# Patient Record
Sex: Male | Born: 1985
Health system: Southern US, Community
[De-identification: ages and names within clinical notes are randomized; demographics above are authoritative.]

## PROBLEM LIST (undated history)

## (undated) DIAGNOSIS — Z9289 Personal history of other medical treatment: Secondary | ICD-10-CM

## (undated) HISTORY — PX: WISDOM TOOTH EXTRACTION: SHX21

---

## 2014-10-17 ENCOUNTER — Emergency Department (HOSPITAL_BASED_OUTPATIENT_CLINIC_OR_DEPARTMENT_OTHER)
Admission: EM | Admit: 2014-10-17 | Discharge: 2014-10-17 | Disposition: A | Discharge status: Home / Self Care | Payer: Medicaid Other | Attending: Emergency Medicine | Admitting: Emergency Medicine

## 2014-10-17 ENCOUNTER — Encounter (HOSPITAL_BASED_OUTPATIENT_CLINIC_OR_DEPARTMENT_OTHER): Payer: Self-pay | Admitting: *Deleted

## 2014-10-17 ENCOUNTER — Emergency Department (HOSPITAL_BASED_OUTPATIENT_CLINIC_OR_DEPARTMENT_OTHER): Payer: Medicaid Other

## 2014-10-17 DIAGNOSIS — J3489 Other specified disorders of nose and nasal sinuses: Secondary | ICD-10-CM | POA: Insufficient documentation

## 2014-10-17 DIAGNOSIS — R52 Pain, unspecified: Secondary | ICD-10-CM

## 2014-10-17 DIAGNOSIS — R0981 Nasal congestion: Secondary | ICD-10-CM | POA: Insufficient documentation

## 2014-10-17 DIAGNOSIS — R51 Headache: Secondary | ICD-10-CM | POA: Diagnosis present

## 2014-10-17 MED ORDER — CETIRIZINE-PSEUDOEPHEDRINE ER 5-120 MG PO TB12: 1.0000 | ORAL_TABLET | Freq: Every day | ORAL | Status: DC

## 2014-10-17 MED ORDER — SALINE SPRAY 0.65 % NA SOLN: 1.0000 | NASAL | Status: DC | PRN

## 2014-10-17 NOTE — ED Provider Notes (Addendum)
CSN: [id number]     Arrival date & time 10/17/14  0850 History   First MD Initiated Contact with Patient 10/17/14 [phone]     Chief Complaint  Patient presents with  . Headache     (Consider location/radiation/quality/duration/timing/severity/associated sxs/prior Treatment) Patient is a 29 y.o. male presenting with headaches. The history is provided by the patient.  Headache Pain location:  R parietal Quality:  Dull Radiates to:  Does not radiate Severity currently:  9/10 Severity at highest:  9/10 Onset quality:  Gradual Duration:  2 weeks Timing:  Intermittent Progression:  Waxing and waning Chronicity:  Recurrent Similar to prior headaches: yes   Relieved by:  NSAIDs Worsened by:  Nothing tried Ineffective treatments:  None tried Associated symptoms: congestion and sinus pressure   Associated symptoms: no abdominal pain, no blurred vision, no fever, no nausea and no vomiting   Associated symptoms comment:  Right sided dental pain Risk factors comment:  Rhinoplasty after a fractured nose 4 months ago   History reviewed. No pertinent past medical history. History reviewed. No pertinent past surgical history. No family history on file. History  Substance Use Topics  . Smoking status: Never Smoker   . Smokeless tobacco: Never Used  . Alcohol Use: No    Review of Systems  Constitutional: Negative for fever.  HENT: Positive for congestion and sinus pressure.   Eyes: Negative for blurred vision.  Gastrointestinal: Negative for nausea, vomiting and abdominal pain.  Neurological: Positive for headaches.  All other systems reviewed and are negative.     Allergies  Review of patient's allergies indicates no known allergies.  Home Medications   Prior to Admission medications   Not on File   BP 124/79 mmHg  Pulse 76  Temp(Src) 97.4 F (36.3 C) (Oral)  Resp 16  Ht 6' (1.829 m)  Wt 150 lb (68.04 kg)  BMI 20.34 kg/m2  SpO2 97% Physical Exam  Constitutional: He  is oriented to person, place, and time. He appears well-developed and well-nourished. No distress.  HENT:  Head: Normocephalic and atraumatic.  Nose: Mucosal edema and sinus tenderness present. No rhinorrhea, nasal deformity or septal deviation. No epistaxis. Right sinus exhibits maxillary sinus tenderness. Right sinus exhibits no frontal sinus tenderness. Left sinus exhibits no maxillary sinus tenderness and no frontal sinus tenderness.  Mouth/Throat: Oropharynx is clear and moist.  Eyes: Conjunctivae and EOM are normal. Pupils are equal, round, and reactive to light.  Neck: Normal range of motion. Neck supple.  Cardiovascular: Normal rate, regular rhythm and intact distal pulses.   No murmur heard. Pulmonary/Chest: Effort normal and breath sounds normal. No respiratory distress. He has no wheezes. He has no rales.  Musculoskeletal: Normal range of motion. He exhibits no edema or tenderness.  Neurological: He is alert and oriented to person, place, and time.  Skin: Skin is warm and dry. No rash noted. No erythema.  Psychiatric: He has a normal mood and affect. His behavior is normal.  Nursing note and vitals reviewed.   ED Course  Procedures (including critical care time) Labs Review Labs Reviewed - No data to display  Imaging Review [place] [facility]  10/17/2014   CLINICAL DATA:  Right maxillary pain and pressure  EXAM: CT PARANASAL SINUS LIMITED WITHOUT CONTRAST  TECHNIQUE: Non-contiguous multidetector CT images of the paranasal sinuses were obtained in a single plane without contrast.  COMPARISON:  None.  FINDINGS: The paranasal sinuses including the sphenoid, ethmoid, frontal and maxillary sinuses are identified without evidence  of disease. No air-fluid levels are identified. There is mild leftward deviation of the nasal septum. The the ostiomeatal complexes are patent bilaterally. The frontoethmoidal recesses are normal bilaterally. No bony abnormalities are identified. The  visualized portions of the brain and orbits are unremarkable.  IMPRESSION: Normal CT of the sinuses.   Electronically Signed   By: Kathreen Devoid   On: 10/17/2014 09:33     EKG Interpretation None      MDM   Final diagnoses:  Pain  Nasal pain  Nasal congestion    Patient with recent rhinoplasty approximately 4 months ago after having a broken nose for the last 2 weeks has had worsening right-sided facial pain, dental pain and congestion. He used Aleve yesterday for headache with mild improvement but has not used any nasal sprays and denies fever. He called the ENTs office today and they told him he would need a referral to see the ENT doctor Laurance Flatten with [facility].  Patient is otherwise well-appearing. Mild tenderness with palpation of the right side of the nose and nasal turbinates on the right side are slightly edematous. Tenderness over the right maxillary sinus.  9:37 AM Pt's sinuses are clear.  Will treat with nasal saline and antihistamines for congestion.  Will have f/u with Dr. Laurance Flatten.  Blanchie Dessert, MD 10/17/14 [place], MD 10/17/14 1001

## 2014-10-17 NOTE — ED Notes (Signed)
Patient states he has a four month history of head pain after having a rhinoplasty for a fractured nose.  States over the last two weeks, he has had a constant headache, teeth pain and pressure and congestion in the right side of his nose and face.

## 2014-10-17 NOTE — ED Notes (Signed)
MD at bedside. 

## 2014-11-23 ENCOUNTER — Emergency Department (HOSPITAL_BASED_OUTPATIENT_CLINIC_OR_DEPARTMENT_OTHER)
Admission: EM | Admit: 2014-11-23 | Discharge: 2014-11-23 | Disposition: A | Discharge status: Home / Self Care | Payer: Medicaid Other | Attending: Emergency Medicine | Admitting: Emergency Medicine

## 2014-11-23 ENCOUNTER — Emergency Department (HOSPITAL_BASED_OUTPATIENT_CLINIC_OR_DEPARTMENT_OTHER): Payer: Medicaid Other

## 2014-11-23 ENCOUNTER — Encounter (HOSPITAL_BASED_OUTPATIENT_CLINIC_OR_DEPARTMENT_OTHER): Payer: Self-pay | Admitting: Emergency Medicine

## 2014-11-23 DIAGNOSIS — Z79891 Long term (current) use of opiate analgesic: Secondary | ICD-10-CM | POA: Diagnosis not present

## 2014-11-23 DIAGNOSIS — K2970 Gastritis, unspecified, without bleeding: Secondary | ICD-10-CM | POA: Insufficient documentation

## 2014-11-23 DIAGNOSIS — Z792 Long term (current) use of antibiotics: Secondary | ICD-10-CM | POA: Diagnosis not present

## 2014-11-23 DIAGNOSIS — Z791 Long term (current) use of non-steroidal anti-inflammatories (NSAID): Secondary | ICD-10-CM | POA: Diagnosis not present

## 2014-11-23 DIAGNOSIS — R109 Unspecified abdominal pain: Secondary | ICD-10-CM | POA: Diagnosis present

## 2014-11-23 HISTORY — DX: Personal history of other medical treatment: Z92.89

## 2014-11-23 LAB — LIPASE, BLOOD: LIPASE: 21 U/L (ref 11–59)

## 2014-11-23 LAB — URINALYSIS, ROUTINE W REFLEX MICROSCOPIC
Bilirubin Urine: NEGATIVE
GLUCOSE, UA: NEGATIVE mg/dL
Ketones, ur: NEGATIVE mg/dL
LEUKOCYTES UA: NEGATIVE
Nitrite: NEGATIVE
PH: 5.5 (ref 5.0–8.0)
Protein, ur: NEGATIVE mg/dL
Specific Gravity, Urine: 1.011 (ref 1.005–1.030)
Urobilinogen, UA: 0.2 mg/dL (ref 0.0–1.0)

## 2014-11-23 LAB — CBC WITH DIFFERENTIAL/PLATELET
Basophils Absolute: 0 10*3/uL (ref 0.0–0.1)
Basophils Relative: 0 % (ref 0–1)
EOS ABS: 0.3 10*3/uL (ref 0.0–0.7)
Eosinophils Relative: 5 % (ref 0–5)
HCT: 44.1 % (ref 39.0–52.0)
HEMOGLOBIN: 14.7 g/dL (ref 13.0–17.0)
LYMPHS ABS: 1.2 10*3/uL (ref 0.7–4.0)
Lymphocytes Relative: 21 % (ref 12–46)
MCH: 30.2 pg (ref 26.0–34.0)
MCHC: 33.3 g/dL (ref 30.0–36.0)
MCV: 90.6 fL (ref 78.0–100.0)
Monocytes Absolute: 0.5 10*3/uL (ref 0.1–1.0)
Monocytes Relative: 8 % (ref 3–12)
NEUTROS ABS: 3.8 10*3/uL (ref 1.7–7.7)
NEUTROS PCT: 66 % (ref 43–77)
Platelets: 193 10*3/uL (ref 150–400)
RBC: 4.87 MIL/uL (ref 4.22–5.81)
RDW: 12.4 % (ref 11.5–15.5)
WBC: 5.7 10*3/uL (ref 4.0–10.5)

## 2014-11-23 LAB — COMPREHENSIVE METABOLIC PANEL
ALBUMIN: 4.3 g/dL (ref 3.5–5.2)
ALT: 22 U/L (ref 0–53)
AST: 27 U/L (ref 0–37)
Alkaline Phosphatase: 77 U/L (ref 39–117)
Anion gap: 2 — ABNORMAL LOW (ref 5–15)
BILIRUBIN TOTAL: 1.1 mg/dL (ref 0.3–1.2)
BUN: 14 mg/dL (ref 6–23)
CO2: 32 mmol/L (ref 19–32)
CREATININE: 0.73 mg/dL (ref 0.50–1.35)
Calcium: 8.9 mg/dL (ref 8.4–10.5)
Chloride: 104 mmol/L (ref 96–112)
GFR calc Af Amer: >90 mL/min (ref >90)
GFR calc non Af Amer: >90 mL/min (ref >90)
Glucose, Bld: 101 mg/dL — ABNORMAL HIGH (ref 70–99)
POTASSIUM: 3.9 mmol/L (ref 3.5–5.1)
SODIUM: 138 mmol/L (ref 135–145)
Total Protein: 7 g/dL (ref 6.0–8.3)

## 2014-11-23 LAB — URINE MICROSCOPIC-ADD ON

## 2014-11-23 MED ORDER — PANTOPRAZOLE SODIUM 40 MG IV SOLR
40.0000 mg | Freq: Once | INTRAVENOUS | Status: AC
  Administered 2014-11-23: 40 mg via INTRAVENOUS
  Filled 2014-11-23: qty 40

## 2014-11-23 MED ORDER — SODIUM CHLORIDE 0.9 % IV BOLUS (SEPSIS)
1000.0000 mL | Freq: Once | INTRAVENOUS | Status: AC
  Administered 2014-11-23: 1000 mL via INTRAVENOUS

## 2014-11-23 MED ORDER — MORPHINE SULFATE 4 MG/ML IJ SOLN
4.0000 mg | Freq: Once | INTRAMUSCULAR | Status: AC
  Administered 2014-11-23: 4 mg via INTRAVENOUS
  Filled 2014-11-23: qty 1

## 2014-11-23 MED ORDER — SUCRALFATE 1 G PO TABS: 1.0000 g | ORAL_TABLET | Freq: Three times a day (TID) | ORAL | Status: DC

## 2014-11-23 MED ORDER — OMEPRAZOLE 20 MG PO CPDR: 20.0000 mg | DELAYED_RELEASE_CAPSULE | Freq: Every day | ORAL | Status: DC

## 2014-11-23 MED ORDER — ONDANSETRON HCL 4 MG/2ML IJ SOLN
4.0000 mg | Freq: Once | INTRAMUSCULAR | Status: AC
  Administered 2014-11-23: 4 mg via INTRAVENOUS
  Filled 2014-11-23: qty 2

## 2014-11-23 NOTE — ED Notes (Signed)
Pt having acute abdominal pain since yesterday evening.  Some diarrhea.  No N/V.  Pt unable to sleep and pain has worsened this am.  Pt bent over, grasping abdomen, unable to speak due to pain.

## 2014-11-23 NOTE — Discharge Instructions (Signed)

## 2014-11-23 NOTE — ED Provider Notes (Signed)
CSN: [id number]     Arrival date & time 11/23/14  4098 History   First MD Initiated Contact with Patient 11/23/14 [phone]     Chief Complaint  Patient presents with  . Abdominal Pain     (Consider location/radiation/quality/duration/timing/severity/associated sxs/prior Treatment) HPI Comments: Patient presents with abdominal pain. He's been taking penicillin ibuprofen and hydrocodone for a dental procedure he had recently. He said he had sudden onset of pain in his epigastrium. This morning. He denies any nausea vomiting or diarrhea. It started more dull like last night and got more pronounced this morning. He's having normal bowel movements. He denies any past abdominal surgeries. He denies any fevers or chills. He denies any urinary symptoms.  Patient is a 29 y.o. male presenting with abdominal pain.  Abdominal Pain Associated symptoms: no chest pain, no chills, no cough, no diarrhea, no fatigue, no fever, no hematuria, no nausea, no shortness of breath and no vomiting     Past Medical History  Diagnosis Date  . History of dental surgery    Past Surgical History  Procedure Laterality Date  . Wisdom tooth extraction     No family history on file. History  Substance Use Topics  . Smoking status: Never Smoker   . Smokeless tobacco: Never Used  . Alcohol Use: No    Review of Systems  Constitutional: Negative for fever, chills, diaphoresis and fatigue.  HENT: Negative for congestion, rhinorrhea and sneezing.   Eyes: Negative.   Respiratory: Negative for cough, chest tightness and shortness of breath.   Cardiovascular: Negative for chest pain and leg swelling.  Gastrointestinal: Positive for abdominal pain. Negative for nausea, vomiting, diarrhea and blood in stool.  Genitourinary: Negative for frequency, hematuria, flank pain and difficulty urinating.  Musculoskeletal: Negative for back pain and arthralgias.  Skin: Negative for rash.  Neurological: Negative for dizziness, speech  difficulty, weakness, numbness and headaches.      Allergies  Review of patient's allergies indicates no known allergies.  Home Medications   Prior to Admission medications   Medication Sig Start Date End Date Taking? Authorizing Provider  HYDROcodone-acetaminophen (NORCO/VICODIN) 5-325 MG per tablet Take 1 tablet by mouth every 6 (six) hours as needed for moderate pain.   Yes Historical Provider, MD  ibuprofen (ADVIL,MOTRIN) 800 MG tablet Take 800 mg by mouth every 8 (eight) hours as needed.   Yes Historical Provider, MD  penicillin v potassium (VEETID) 500 MG tablet Take 500 mg by mouth 4 (four) times daily.   Yes Historical Provider, MD  omeprazole (PRILOSEC) 20 MG capsule Take 1 capsule (20 mg total) by mouth daily. 11/23/14   Malvin Johns, MD  sucralfate (CARAFATE) 1 G tablet Take 1 tablet (1 g total) by mouth 4 (four) times daily -  with meals and at bedtime. 11/23/14   Malvin Johns, MD   BP 107/60 mmHg  Pulse 70  Temp(Src) 97.7 F (36.5 C)  Resp 22  Ht 6' (1.829 m)  Wt 150 lb (68.04 kg)  BMI 20.34 kg/m2  SpO2 99% Physical Exam  Constitutional: He is oriented to person, place, and time. He appears well-developed and well-nourished.  HENT:  Head: Normocephalic and atraumatic.  Eyes: Pupils are equal, round, and reactive to light.  Neck: Normal range of motion. Neck supple.  Cardiovascular: Normal rate, regular rhythm and normal heart sounds.   Pulmonary/Chest: Effort normal and breath sounds normal. No respiratory distress. He has no wheezes. He has no rales. He exhibits no tenderness.  Abdominal: Soft. Bowel  sounds are normal. There is tenderness (moderate tenderness to epigastrium). There is no rebound and no guarding.  Musculoskeletal: Normal range of motion. He exhibits no edema.  Lymphadenopathy:    He has no cervical adenopathy.  Neurological: He is alert and oriented to person, place, and time.  Skin: Skin is warm and dry. No rash noted.  Psychiatric: He has a  normal mood and affect.    ED Course  Procedures (including critical care time) Labs Review Labs Reviewed  COMPREHENSIVE METABOLIC PANEL - Abnormal; Notable for the following:    Glucose, Bld 101 (*)    Anion gap 2 (*)    All other components within normal limits  URINALYSIS, ROUTINE W REFLEX MICROSCOPIC - Abnormal; Notable for the following:    Hgb urine dipstick TRACE (*)    All other components within normal limits  CBC WITH DIFFERENTIAL/PLATELET  LIPASE, BLOOD  URINE MICROSCOPIC-ADD ON    Imaging Review Dg Abd Acute W/chest  11/23/2014   CLINICAL DATA:  Abdominal pain and diarrhea  EXAM: ACUTE ABDOMEN SERIES (ABDOMEN 2 VIEW & CHEST 1 VIEW)  COMPARISON:  None.  FINDINGS: Cardiac shadow is within normal limits. The lungs are clear bilaterally. Mild apical scarring is noted. No acute bony abnormality is seen.  Scattered large and small bowel gas is noted. No obstructive changes are seen. No abnormal calcifications are noted. No acute bony abnormality is seen.  IMPRESSION: No acute abnormality noted.   Electronically Signed   By: Inez Catalina M.D.   On: 11/23/2014 09:46     EKG Interpretation None      MDM   Final diagnoses:  Gastritis    Patient is given 1 dose of morphine as well as Zofran. He's given Protonix. He is feeling much better after this. He had no ongoing pain. His lipase is normal without evidence of pancreatitis. There is no pain around his gallbladder. He has no evidence of bowel obstruction. His repeat abdominal exam is benign. He's tolerating by mouth fluids. I feel this likely represents a gastritis. He was given a prescription for Carafate and Prilosec. He was given outpatient referral to gastroenterology if his symptoms continue.    Malvin Johns, MD 11/23/14 1314

## 2015-10-24 ENCOUNTER — Emergency Department (HOSPITAL_COMMUNITY)
Admission: EM | Admit: 2015-10-24 | Discharge: 2015-10-25 | Disposition: A | Discharge status: Home / Self Care | Payer: Medicaid Other | Attending: Emergency Medicine | Admitting: Emergency Medicine

## 2015-10-24 ENCOUNTER — Encounter (HOSPITAL_COMMUNITY): Payer: Self-pay | Admitting: Emergency Medicine

## 2015-10-24 DIAGNOSIS — Y998 Other external cause status: Secondary | ICD-10-CM | POA: Insufficient documentation

## 2015-10-24 DIAGNOSIS — F4329 Adjustment disorder with other symptoms: Secondary | ICD-10-CM | POA: Diagnosis present

## 2015-10-24 DIAGNOSIS — T39312A Poisoning by propionic acid derivatives, intentional self-harm, initial encounter: Secondary | ICD-10-CM | POA: Insufficient documentation

## 2015-10-24 DIAGNOSIS — Y9389 Activity, other specified: Secondary | ICD-10-CM | POA: Diagnosis not present

## 2015-10-24 DIAGNOSIS — Z791 Long term (current) use of non-steroidal anti-inflammatories (NSAID): Secondary | ICD-10-CM | POA: Insufficient documentation

## 2015-10-24 DIAGNOSIS — Y9289 Other specified places as the place of occurrence of the external cause: Secondary | ICD-10-CM | POA: Insufficient documentation

## 2015-10-24 DIAGNOSIS — T50902A Poisoning by unspecified drugs, medicaments and biological substances, intentional self-harm, initial encounter: Secondary | ICD-10-CM

## 2015-10-24 NOTE — ED Notes (Signed)
Pt here with [employer] for taking 10 500 mg naproxen tablet around 2200. Pt alert and oriented

## 2015-10-25 DIAGNOSIS — F4329 Adjustment disorder with other symptoms: Secondary | ICD-10-CM | POA: Diagnosis not present

## 2015-10-25 LAB — CBC
HCT: 44.6 % (ref 39.0–52.0)
HEMOGLOBIN: 14.9 g/dL (ref 13.0–17.0)
MCH: 30.5 pg (ref 26.0–34.0)
MCHC: 33.4 g/dL (ref 30.0–36.0)
MCV: 91.2 fL (ref 78.0–100.0)
Platelets: 234 10*3/uL (ref 150–400)
RBC: 4.89 MIL/uL (ref 4.22–5.81)
RDW: 12.7 % (ref 11.5–15.5)
WBC: 6.3 10*3/uL (ref 4.0–10.5)

## 2015-10-25 LAB — ETHANOL: Alcohol, Ethyl (B): <5 mg/dL (ref <5)

## 2015-10-25 LAB — COMPREHENSIVE METABOLIC PANEL
ALBUMIN: 5.1 g/dL — AB (ref 3.5–5.0)
ALT: 17 U/L (ref 17–63)
ANION GAP: 12 (ref 5–15)
AST: 21 U/L (ref 15–41)
Alkaline Phosphatase: 91 U/L (ref 38–126)
BUN: 25 mg/dL — ABNORMAL HIGH (ref 6–20)
CO2: 27 mmol/L (ref 22–32)
Calcium: 9.2 mg/dL (ref 8.9–10.3)
Chloride: 100 mmol/L — ABNORMAL LOW (ref 101–111)
Creatinine, Ser: 0.87 mg/dL (ref 0.61–1.24)
GFR calc Af Amer: >60 mL/min (ref >60)
GFR calc non Af Amer: >60 mL/min (ref >60)
GLUCOSE: 124 mg/dL — AB (ref 65–99)
Potassium: 3.6 mmol/L (ref 3.5–5.1)
SODIUM: 139 mmol/L (ref 135–145)
Total Bilirubin: 1.8 mg/dL — ABNORMAL HIGH (ref 0.3–1.2)
Total Protein: 7.7 g/dL (ref 6.5–8.1)

## 2015-10-25 LAB — RAPID URINE DRUG SCREEN, HOSP PERFORMED
Amphetamines: NOT DETECTED
BARBITURATES: NOT DETECTED
BENZODIAZEPINES: NOT DETECTED
COCAINE: NOT DETECTED
Opiates: NOT DETECTED
TETRAHYDROCANNABINOL: NOT DETECTED

## 2015-10-25 LAB — CBG MONITORING, ED: Glucose-Capillary: 87 mg/dL (ref 65–99)

## 2015-10-25 LAB — SALICYLATE LEVEL: Salicylate Lvl: <4 mg/dL (ref 2.8–30.0)

## 2015-10-25 LAB — ACETAMINOPHEN LEVEL: Acetaminophen (Tylenol), Serum: <10 ug/mL — ABNORMAL LOW (ref 10–30)

## 2015-10-25 NOTE — ED Notes (Signed)
Patient is INVOLUNTARILY COMMITTED

## 2015-10-25 NOTE — BH Assessment (Addendum)
Tele Assessment Note   Mario Simon is an 30 y.o. male.  -Clinician reviewed note by Dr. Ralene Bathe.  Pt had taken an overdose of about 10 naproxen in an effort to kill himself.  Patient said that he took the pills to harm himself because he and wife are having trouble in their marriage.  Patient explains that he and wife are having trouble in their marriage.  She has talked about moving out with their two children (ages 80 and 64).  Patient is afraid of losing the children and not being able to see them.  He relates that in 2015 they had a daughter that died at age 54 months.  He said that he did not want to have to lose the other children because of the marriage ending.  Patient appears to be devoted to family.  His wife is here at the hospital.  Patient has no current thoughts of killing himself.  He says he would never take that type of action again and the he was reacting out of fear.  Patient has no previous SI plans or attempts or intentions.  Patient denies any current recurrent HI and no A/V hallucinations.  Patient has no SA issues.  No previous inpatient or outpatient care.  -Clinician discussed patient care with Patriciaann Clan, PA.  He recommended patient be seen by psychiatry in AM to uphold or rescind the IVC.  Clinician discussed with Dr. Ralene Bathe and she agreed that patient needs to stay until psychiatry can talk with patient.  Diagnosis: MDD, single episode  Past Medical History:  Past Medical History  Diagnosis Date  . History of dental surgery     Past Surgical History  Procedure Laterality Date  . Wisdom tooth extraction      Family History: History reviewed. No pertinent family history.  Social History:  reports that he has never smoked. He has never used smokeless tobacco. He reports that he does not drink alcohol or use illicit drugs.  Additional Social History:  Alcohol / Drug Use Pain Medications: None Prescriptions: None Over the Counter: Naproxen sodium (prn) History  of alcohol / drug use?: No history of alcohol / drug abuse  CIWA: CIWA-Ar BP: 134/82 mmHg Pulse Rate: 96 COWS:    PATIENT STRENGTHS: (choose at least two) Ability for insight Average or above average intelligence Capable of independent living Communication skills Supportive family/friends  Allergies: No Known Allergies  Home Medications:  (Not in a hospital admission)  OB/GYN Status:  No LMP for male patient.  General Assessment Data Location of Assessment: [facility] TTS Assessment: In system Is this a Tele or Face-to-Face Assessment?: Face-to-Face Is this an Initial Assessment or a Re-assessment for this encounter?: Initial Assessment Marital status: Married Is patient pregnant?: No Pregnancy Status: No Living Arrangements: Spouse/significant other Can pt return to current living arrangement?: Yes Admission Status: Involuntary Is patient capable of signing voluntary admission?: No Referral Source: Self/Family/Friend Insurance type: MCD     Crisis Care Plan Living Arrangements: Spouse/significant other Name of Psychiatrist: None Name of Therapist: None  Education Status Is patient currently in school?: Yes Highest grade of school patient has completed: Some college  Risk to self with the past 6 months Suicidal Ideation: No-Not Currently/Within Last 6 Months Has patient been a risk to self within the past 6 months prior to admission? : No Suicidal Intent: No Has patient had any suicidal intent within the past 6 months prior to admission? : No Is patient at risk for suicide?:  Yes Suicidal Plan?: No Has patient had any suicidal plan within the past 6 months prior to admission? : No Access to Means: No What has been your use of drugs/alcohol within the last 12 months?: None Previous Attempts/Gestures: No How many times?: 0 Other Self Harm Risks: None Triggers for Past Attempts: None known Intentional Self Injurious Behavior: None Family Suicide History:  No Recent stressful life event(s): Conflict (Comment), Financial Problems (Conflict w/ wife over work.) Persecutory voices/beliefs?: No Depression: Yes Depression Symptoms: Despondent, Loss of interest in usual pleasures Substance abuse history and/or treatment for substance abuse?: No Suicide prevention information given to non-admitted patients: Not applicable  Risk to Others within the past 6 months Homicidal Ideation: No Does patient have any lifetime risk of violence toward others beyond the six months prior to admission? : No Thoughts of Harm to Others: No Current Homicidal Intent: No Current Homicidal Plan: No Access to Homicidal Means: No Identified Victim: No one History of harm to others?: No Assessment of Violence: None Noted Violent Behavior Description: None reported Does patient have access to weapons?: No Criminal Charges Pending?: No Does patient have a court date: No Is patient on probation?: No  Psychosis Hallucinations: None noted Delusions: None noted  Mental Status Report Appearance/Hygiene: In scrubs, Unremarkable Eye Contact: Good Motor Activity: Freedom of movement, Unremarkable Speech: Logical/coherent, Soft Level of Consciousness: Alert Mood: Anxious, Apprehensive Affect: Anxious, Apprehensive, Sad Anxiety Level: Moderate Thought Processes: Coherent, Relevant Judgement: Unimpaired Orientation: Person, Place, Time, Situation Obsessive Compulsive Thoughts/Behaviors: None  Cognitive Functioning Concentration: Normal Memory: Recent Intact, Remote Intact IQ: Average Insight: Fair Impulse Control: Poor Appetite: Good Weight Loss: 0 Weight Gain: 0 Sleep: Decreased Total Hours of Sleep:  (Taking a little longer to get to sleep.) Vegetative Symptoms: None  ADLScreening [facility] Assessment Services) Patient's cognitive ability adequate to safely complete daily activities?: Yes Patient able to express need for assistance with ADLs?:  Yes Independently performs ADLs?: Yes (appropriate for developmental age)  Prior Inpatient Therapy Prior Inpatient Therapy: Yes Prior Therapy Dates: None Prior Therapy Facilty/Provider(s): None Reason for Treatment: None  Prior Outpatient Therapy Prior Outpatient Therapy: No Prior Therapy Dates: N/A Prior Therapy Facilty/Provider(s): N/A Reason for Treatment: N/A Does patient have an ACCT team?: No Does patient have Intensive In-House Services?  : No Does patient have [facility] services? : No Does patient have P4CC services?: No  ADL Screening (condition at time of admission) Patient's cognitive ability adequate to safely complete daily activities?: Yes Is the patient deaf or have difficulty hearing?: No Does the patient have difficulty seeing, even when wearing glasses/contacts?: No Does the patient have difficulty concentrating, remembering, or making decisions?: No Patient able to express need for assistance with ADLs?: Yes Does the patient have difficulty dressing or bathing?: No Independently performs ADLs?: Yes (appropriate for developmental age) Does the patient have difficulty walking or climbing stairs?: No Weakness of Legs: None Weakness of Arms/Hands: None       Abuse/Neglect Assessment (Assessment to be complete while patient is alone) Physical Abuse: Denies Verbal Abuse: Denies Sexual Abuse: Denies Exploitation of patient/patient's resources: Denies Self-Neglect: Denies     Regulatory affairs officer (For Healthcare) Does patient have an advance directive?: No Would patient like information on creating an advanced directive?: No - patient declined information    Additional Information 1:1 In Past 12 Months?: No CIRT Risk: No Elopement Risk: No Does patient have medical clearance?: Yes     Disposition:  Disposition Initial Assessment Completed for this Encounter: Yes Disposition of  Patient: Other dispositions Other disposition(s): Other (Comment) (Pt to be  reviewed with PA)  Curlene Dolphin Ray 10/25/2015 3:46 AM

## 2015-10-25 NOTE — ED Provider Notes (Signed)
CSN: [id number]     Arrival date & time 10/24/15  2348 History  By signing my name below, I, Mario Simon, attest that this documentation has been prepared under the direction and in the presence of Quintella Reichert, MD. Electronically Signed: Soijett Simon, ED Scribe. 10/25/2015. 12:59 AM.   Chief Complaint  Patient presents with  . Drug Overdose    Pt took 10 naproxen      The history is provided by the patient. No language interpreter was used.    HPI Comments: Mario Simon is a 30 y.o. male  who presents to the Emergency Department complaining of drug overdose onset 2 hours ago. He reports that he took from 8-10 naprosyn and he is unsure of the Rx strength of the medications. He states that he took the medications due to having issues with his wife potentially leaving him. He notes that he was scared because typically the children go with the mother and he doesn't want his children taken away from him. He denies trying to harm himself or trying to harm himself in the past. He denies every seeing a psychiatrist or therapist. He reports that he does not have any pain currently, but he feels stupid at what he did. Pt notes that his mother-in-law called the cops for the situation. He states that he has not tried any medications for the relief for his symptoms. He denies nausea and any other symptoms. Denies any PMHx. Denies smoking cigarettes and he is an occasional alcohol user. Denies illegal street drug use or access to weapons in the house.   Past Medical History  Diagnosis Date  . History of dental surgery    Past Surgical History  Procedure Laterality Date  . Wisdom tooth extraction     History reviewed. No pertinent family history. Social History  Substance Use Topics  . Smoking status: Never Smoker   . Smokeless tobacco: Never Used  . Alcohol Use: No    Review of Systems  Gastrointestinal: Negative for nausea.  Skin: Negative for color change, rash and wound.   Psychiatric/Behavioral: Negative for suicidal ideas.  All other systems reviewed and are negative.     Allergies  Review of patient's allergies indicates no known allergies.  Home Medications   Prior to Admission medications   Medication Sig Start Date End Date Taking? Authorizing Provider  ibuprofen (ADVIL,MOTRIN) 800 MG tablet Take 800 mg by mouth every 8 (eight) hours as needed for moderate pain.    Yes Historical Provider, MD  naproxen (NAPROSYN) 500 MG tablet Take 500 mg by mouth 2 (two) times daily with a meal.   Yes Historical Provider, MD  omeprazole (PRILOSEC) 20 MG capsule Take 1 capsule (20 mg total) by mouth daily. Patient not taking: Reported on 10/25/2015 11/23/14   Malvin Johns, MD  sucralfate (CARAFATE) 1 G tablet Take 1 tablet (1 g total) by mouth 4 (four) times daily -  with meals and at bedtime. Patient not taking: Reported on 10/25/2015 11/23/14   Malvin Johns, MD   BP 113/72 mmHg  Pulse 100  Temp(Src) 98.1 F (36.7 C) (Oral)  Resp 16  SpO2 95% Physical Exam  Constitutional: He is oriented to person, place, and time. He appears well-developed and well-nourished.  HENT:  Head: Normocephalic and atraumatic.  Cardiovascular: Normal rate and regular rhythm.   No murmur heard. Pulmonary/Chest: Effort normal and breath sounds normal. No respiratory distress.  Abdominal: Soft. There is no tenderness. There is no rebound and no guarding.  Musculoskeletal: He exhibits no edema or tenderness.  Neurological: He is alert and oriented to person, place, and time.  Skin: Skin is warm and dry.  Psychiatric: He has a normal mood and affect. His behavior is normal.  Nursing note and vitals reviewed.   ED Course  Procedures (including critical care time) DIAGNOSTIC STUDIES: Oxygen Saturation is 98% on RA, nl by my interpretation.    COORDINATION OF CARE: 12:59 AM Discussed treatment plan with pt at bedside which includes labs, UA, EKG and pt agreed to plan.  1:24 AM-  Pt reassessed and he is asymptomatic at this time.  2:00 AM- Pt reassessed and he has no complaints at this time.  Labs Review Labs Reviewed  COMPREHENSIVE METABOLIC PANEL - Abnormal; Notable for the following:    Chloride 100 (*)    Glucose, Bld 124 (*)    BUN 25 (*)    Albumin 5.1 (*)    Total Bilirubin 1.8 (*)    All other components within normal limits  ACETAMINOPHEN LEVEL - Abnormal; Notable for the following:    Acetaminophen (Tylenol), Serum <10 (*)    All other components within normal limits  ETHANOL  SALICYLATE LEVEL  CBC  URINE RAPID DRUG SCREEN, HOSP PERFORMED  CBG MONITORING, ED    Imaging Review No results found. I have personally reviewed and evaluated these images and lab results as part of my medical decision-making.   EKG Interpretation   Date/Time:  Thursday October 25 2015 00:33:35 EST Ventricular Rate:  99 PR Interval:  138 QRS Duration: 103 QT Interval:  363 QTC Calculation: 466 R Axis:   83 Text Interpretation:  Sinus rhythm Biatrial enlargement ST elev, probable  normal early repol pattern Confirmed by Hazle Coca [phone]) on 10/25/2015  12:43:04 AM      MDM   Final diagnoses:  Drug overdose, undetermined intent, initial encounter    Patient here for psychiatric evaluation following ingestion of naproxen. Patient states that this was an attempt to scare his wife following a disagreement. He has been asymptomatic during his emergency department stay. He has been medically cleared for psychiatric evaluation. He was IVCd by police prior to ED arrival.  I personally performed the services described in this documentation, which was scribed in my presence. The recorded information has been reviewed and is accurate.   Quintella Reichert, MD 10/25/15 [phone]

## 2015-10-25 NOTE — [facility] Suicide Risk Assessment (Cosign Needed)
Suicide Risk Assessment  Discharge Assessment   [facility] Discharge Suicide Risk Assessment   Principal Problem: Adjustment disorder with disturbance of emotion Discharge Diagnoses:  Patient Active Problem List   Diagnosis Date Noted  . Adjustment disorder with disturbance of emotion [F43.29] 10/25/2015    Priority: Medium    Total Time spent with patient: 20 minutes  Musculoskeletal: Strength & Muscle Tone: within normal limits Gait & Station: normal Patient leans: N/A  Psychiatric Specialty Exam:   Blood pressure 138/89, pulse 112, temperature 97.9 F (36.6 C), temperature source Oral, resp. rate 15, SpO2 100 %.There is no weight on file to calculate BMI.  General Appearance: Casual and Fairly Groomed  Engineer, water:: Good  Speech: Clear and Coherent and Normal Rate  Volume: Normal  Mood: Euthymic  Affect: Congruent  Thought Process: Coherent, Goal Directed and Intact  Orientation: Full (Time, Place, and Person)  Thought Content: WDL  Suicidal Thoughts: No  Homicidal Thoughts: No  Memory: Immediate; Good Recent; Good Remote; Good  Judgement: Good  Insight: Good  Psychomotor Activity: Normal  Concentration: Good  Recall: NA  Fund of Knowledge:Good  Language: Good  Akathisia: No  Handed: Right  AIMS (if indicated):    Assets: Desire for Improvement  ADL's: Intact  Cognition: WNL         Mental Status Per Nursing Assessment::   On Admission:     Demographic Factors:  Male and Adolescent or young adult  Loss Factors: NA  Historical Factors: NA  Risk Reduction Factors:   Responsible for children under 22 years of age, Sense of responsibility to family, Religious beliefs about death, Living with another person, especially a relative and Positive therapeutic relationship  Continued Clinical Symptoms:  Depression:   Insomnia  Cognitive Features That Contribute To Risk:  Polarized thinking    Suicide  Risk:  Minimal: No identifiable suicidal ideation.  Patients presenting with no risk factors but with morbid ruminations; may be classified as minimal risk based on the severity of the depressive symptoms    Plan Of Care/Follow-up recommendations:  Activity:  As tolerated Diet:  regular  Delfin Gant, NP    PMHNP-BC 10/25/2015, 12:57 PM

## 2015-10-25 NOTE — ED Notes (Signed)
Patient wanded by security; patient's belongings searched by security. Patient is changed into purple scrubs.

## 2015-10-25 NOTE — ED Notes (Signed)
Per PC watch pt for 2 hours to look for nausea or vomiting.

## 2015-10-25 NOTE — ED Notes (Signed)
Poison control cleared patient at this time.

## 2015-10-25 NOTE — ED Notes (Signed)
Patient made aware that psychiatry has to evaluate patient before he can be released

## 2015-10-25 NOTE — Consult Note (Signed)
[facility] Consult   Reason for Consult:  OD,depression Referring Physician:  EDP Patient Identification: Mario Simon MRN:  [id number] Principal Diagnosis: Adjustment disorder with disturbance of emotion Diagnosis:   Patient Active Problem List   Diagnosis Date Noted  . Adjustment disorder with disturbance of emotion [F43.29] 10/25/2015    Priority: Medium    Total Time spent with patient: 45 minutes  Subjective:   Mario Simon is a 30 y.o. male patient admitted with OD depression.  HPI:  Hispanic male, 37 old was evaluated after he OD on 8-10 tablets of Naprosyn.  Patient states that he took the pills to get his wife's attention and not to kill himself.  Patient reports that he works a lot and has no time to spend with his wife.  He reports that they lost a 27 month old son 28 and that he and his wife are still struggling with the death.  Patient vehemently denies wanting to kill self.  He states that he love his wife and 2 kids and will never do anything to kill himself.  He denies previous attempt to kill self.  Patient denies SI/HI/AVH.  Patient is now discharged home and will follow up with [facility].  Past Psychiatric History: Denies   Risk to Self: Suicidal Ideation: No-Not Currently/Within Last 6 Months Suicidal Intent: No Is patient at risk for suicide?: Yes Suicidal Plan?: No Access to Means: No What has been your use of drugs/alcohol within the last 12 months?: None How many times?: 0 Other Self Harm Risks: None Triggers for Past Attempts: None known Intentional Self Injurious Behavior: None Risk to Others: Homicidal Ideation: No Thoughts of Harm to Others: No Current Homicidal Intent: No Current Homicidal Plan: No Access to Homicidal Means: No Identified Victim: No one History of harm to others?: No Assessment of Violence: None Noted Violent Behavior Description: None reported Does patient have access to weapons?: No Criminal Charges  Pending?: No Does patient have a court date: No Prior Inpatient Therapy: Prior Inpatient Therapy: Yes Prior Therapy Dates: None Prior Therapy Facilty/Provider(s): None Reason for Treatment: None Prior Outpatient Therapy: Prior Outpatient Therapy: No Prior Therapy Dates: N/A Prior Therapy Facilty/Provider(s): N/A Reason for Treatment: N/A Does patient have an ACCT team?: No Does patient have Intensive In-House Services?  : No Does patient have [facility] services? : No Does patient have P4CC services?: No  Past Medical History:  Past Medical History  Diagnosis Date  . History of dental surgery     Past Surgical History  Procedure Laterality Date  . Wisdom tooth extraction     Family History: History reviewed. No pertinent family history. Family Psychiatric  History:   Unknown Social History:  History  Alcohol Use No     History  Drug Use No    Social History   Social History  . Marital Status: Married    Spouse Name: N/A  . Number of Children: N/A  . Years of Education: N/A   Social History Main Topics  . Smoking status: Never Smoker   . Smokeless tobacco: Never Used  . Alcohol Use: No  . Drug Use: No  . Sexual Activity: Not Asked   Other Topics Concern  . None   Social History Narrative   Additional Social History:    Pain Medications: None Prescriptions: None Over the Counter: Naproxen sodium (prn) History of alcohol / drug use?: No history of alcohol / drug abuse   Allergies:  No Known Allergies  Labs:  Results for orders placed or performed during the hospital encounter of 10/24/15 (from the past 48 hour(s))  Comprehensive metabolic panel     Status: Abnormal   Collection Time: 10/25/15 12:14 AM  Result Value Ref Range   Sodium 139 135 - 145 mmol/L   Potassium 3.6 3.5 - 5.1 mmol/L   Chloride 100 (L) 101 - 111 mmol/L   CO2 27 22 - 32 mmol/L   Glucose, Bld 124 (H) 65 - 99 mg/dL   BUN 25 (H) 6 - 20 mg/dL   Creatinine, Ser 0.87 0.61 - 1.24 mg/dL    Calcium 9.2 8.9 - 10.3 mg/dL   Total Protein 7.7 6.5 - 8.1 g/dL   Albumin 5.1 (H) 3.5 - 5.0 g/dL   AST 21 15 - 41 U/L   ALT 17 17 - 63 U/L   Alkaline Phosphatase 91 38 - 126 U/L   Total Bilirubin 1.8 (H) 0.3 - 1.2 mg/dL   GFR calc non Af Amer >60 >60 mL/min   GFR calc Af Amer >60 >60 mL/min    Comment: (NOTE) The eGFR has been calculated using the CKD EPI equation. This calculation has not been validated in all clinical situations. eGFR's persistently <60 mL/min signify possible Chronic Kidney Disease.    Anion gap 12 5 - 15  Ethanol (ETOH)     Status: None   Collection Time: 10/25/15 12:14 AM  Result Value Ref Range   Alcohol, Ethyl (B) <5 <5 mg/dL    Comment:        LOWEST DETECTABLE LIMIT FOR SERUM ALCOHOL IS 5 mg/dL FOR MEDICAL PURPOSES ONLY   Salicylate level     Status: None   Collection Time: 10/25/15 12:14 AM  Result Value Ref Range   Salicylate Lvl <3.9 2.8 - 30.0 mg/dL  Acetaminophen level     Status: Abnormal   Collection Time: 10/25/15 12:14 AM  Result Value Ref Range   Acetaminophen (Tylenol), Serum <10 (L) 10 - 30 ug/mL    Comment:        THERAPEUTIC CONCENTRATIONS VARY SIGNIFICANTLY. A RANGE OF 10-30 ug/mL MAY BE AN EFFECTIVE CONCENTRATION FOR MANY PATIENTS. HOWEVER, SOME ARE BEST TREATED AT CONCENTRATIONS OUTSIDE THIS RANGE. ACETAMINOPHEN CONCENTRATIONS >150 ug/mL AT 4 HOURS AFTER INGESTION AND >50 ug/mL AT 12 HOURS AFTER INGESTION ARE OFTEN ASSOCIATED WITH TOXIC REACTIONS.   CBC     Status: None   Collection Time: 10/25/15 12:14 AM  Result Value Ref Range   WBC 6.3 4.0 - 10.5 K/uL   RBC 4.89 4.22 - 5.81 MIL/uL   Hemoglobin 14.9 13.0 - 17.0 g/dL   HCT 44.6 39.0 - 52.0 %   MCV 91.2 78.0 - 100.0 fL   MCH 30.5 26.0 - 34.0 pg   MCHC 33.4 30.0 - 36.0 g/dL   RDW 12.7 11.5 - 15.5 %   Platelets 234 150 - 400 K/uL  CBG monitoring, ED     Status: None   Collection Time: 10/25/15 12:39 AM  Result Value Ref Range   Glucose-Capillary 87 65 - 99  mg/dL  Urine rapid drug screen (hosp performed) (Not at [facility])     Status: None   Collection Time: 10/25/15 12:43 AM  Result Value Ref Range   Opiates NONE DETECTED NONE DETECTED   Cocaine NONE DETECTED NONE DETECTED   Benzodiazepines NONE DETECTED NONE DETECTED   Amphetamines NONE DETECTED NONE DETECTED   Tetrahydrocannabinol NONE DETECTED NONE DETECTED   Barbiturates NONE DETECTED NONE DETECTED    Comment:  DRUG SCREEN FOR MEDICAL PURPOSES ONLY.  IF CONFIRMATION IS NEEDED FOR ANY PURPOSE, NOTIFY LAB WITHIN 5 DAYS.        LOWEST DETECTABLE LIMITS FOR URINE DRUG SCREEN Drug Class       Cutoff (ng/mL) Amphetamine      1000 Barbiturate      200 Benzodiazepine   259 Tricyclics       563 Opiates          300 Cocaine          300 THC              50     No current facility-administered medications for this encounter.   Current Outpatient Prescriptions  Medication Sig Dispense Refill  . ibuprofen (ADVIL,MOTRIN) 800 MG tablet Take 800 mg by mouth every 8 (eight) hours as needed for moderate pain.     . naproxen (NAPROSYN) 500 MG tablet Take 500 mg by mouth 2 (two) times daily with a meal.    . omeprazole (PRILOSEC) 20 MG capsule Take 1 capsule (20 mg total) by mouth daily. (Patient not taking: Reported on 10/25/2015) 30 capsule 0  . sucralfate (CARAFATE) 1 G tablet Take 1 tablet (1 g total) by mouth 4 (four) times daily -  with meals and at bedtime. (Patient not taking: Reported on 10/25/2015) 30 tablet 0    Musculoskeletal: Strength & Muscle Tone: within normal limits Gait & Station: normal Patient leans: N/A  Psychiatric Specialty Exam: Review of Systems  Constitutional: Negative.   HENT: Negative.   Eyes: Negative.   Respiratory: Negative.   Cardiovascular: Negative.   Gastrointestinal: Negative.   Genitourinary: Negative.   Musculoskeletal: Negative.   Skin: Negative.   Neurological: Negative.   Endo/Heme/Allergies: Negative.     Blood pressure 113/72, pulse  100, temperature 98.1 F (36.7 C), temperature source Oral, resp. rate 16, SpO2 95 %.There is no weight on file to calculate BMI.  General Appearance: Casual and Fairly Groomed  Engineer, water::  Good  Speech:  Clear and Coherent and Normal Rate  Volume:  Normal  Mood:  Euthymic  Affect:  Congruent  Thought Process:  Coherent, Goal Directed and Intact  Orientation:  Full (Time, Place, and Person)  Thought Content:  WDL  Suicidal Thoughts:  No  Homicidal Thoughts:  No  Memory:  Immediate;   Good Recent;   Good Remote;   Good  Judgement:  Good  Insight:  Good  Psychomotor Activity:  Normal  Concentration:  Good  Recall:  NA  Fund of Knowledge:Good  Language: Good  Akathisia:  No  Handed:  Right  AIMS (if indicated):     Assets:  Desire for Improvement  ADL's:  Intact  Cognition: WNL  Sleep:      Disposition: Discharge home, follow up with [facility] for outpatient [facility] care and counseling.  Delfin Gant   PMHNP-BC 10/25/2015 11:47 AM Patient seen face-to-face for psychiatric evaluation, chart reviewed and case discussed with the physician extender and developed treatment plan. Reviewed the information documented and agree with the treatment plan. Corena Pilgrim, MD

## 2015-10-25 NOTE — Discharge Instructions (Signed)
For your ongoing behavioral health needs you are advised to follow up with [facility].  New patients are seen at their walk-in clinic.  Walk-in hours are Monday - Friday from 8:00 am - 12:00 pm, and from 1:00 pm - 3:00 pm.  Walk-in patients are seen on a first come, first served basis, so try to arrive as early as possible for the best chance of being seen the same day.  There is an initial fee of $22.50: ° °     [facility] °     [address] °     [place], [place] [address] °     [phone] °

## 2015-10-25 NOTE — BH Assessment (Signed)
[facility] Assessment Progress Note  Per Corena Pilgrim, MD, this pt does not require psychiatric hospitalization at this time.  He presents under IVC initiated by [facility] officer T. Timmothy Euler.  Pt is to be released from [facility] and discharged from [facility] with referral information for [facility] the [place].  IVC has been rescinded.  Referral information for [facility] has been included in pt's discharge instructions.  Pt's nurse has been notified.  Jalene Mullet, [facility] Triage Specialist [phone]

## 2015-10-25 NOTE — ED Notes (Signed)
Patient calling wife to let her know that he will be at the hospital for a while.

## 2015-10-25 NOTE — ED Notes (Signed)
Patient speaking to TTS.  

## 2015-10-25 NOTE — ED Notes (Signed)
Patient requesting to speak to MD. Ralene Bathe, MD notified.

## 2015-10-25 NOTE — ED Notes (Addendum)
MD at bedside. Patient states he took the naproxen pills to scare his wife because they were having a disagreement. Patient denies suicidal intentions, denies suicidal intentions in the past. Patient alert, oriented, and appears in no acute distress

## 2017-06-15 ENCOUNTER — Emergency Department (HOSPITAL_BASED_OUTPATIENT_CLINIC_OR_DEPARTMENT_OTHER)
Admission: EM | Admit: 2017-06-15 | Discharge: 2017-06-15 | Disposition: A | Discharge status: Home / Self Care | Payer: Self-pay | Attending: Emergency Medicine | Admitting: Emergency Medicine

## 2017-06-15 ENCOUNTER — Encounter (HOSPITAL_BASED_OUTPATIENT_CLINIC_OR_DEPARTMENT_OTHER): Payer: Self-pay | Admitting: Emergency Medicine

## 2017-06-15 DIAGNOSIS — Z79899 Other long term (current) drug therapy: Secondary | ICD-10-CM | POA: Insufficient documentation

## 2017-06-15 DIAGNOSIS — M25512 Pain in left shoulder: Secondary | ICD-10-CM | POA: Insufficient documentation

## 2017-06-15 MED ORDER — KETOROLAC TROMETHAMINE 15 MG/ML IJ SOLN
15.0000 mg | Freq: Once | INTRAMUSCULAR | Status: AC
  Administered 2017-06-15: 15 mg via INTRAMUSCULAR
  Filled 2017-06-15: qty 1

## 2017-06-15 MED ORDER — KETOROLAC TROMETHAMINE 15 MG/ML IJ SOLN: 15.0000 mg | Freq: Once | INTRAMUSCULAR | Status: DC

## 2017-06-15 NOTE — Discharge Instructions (Signed)
Do not take Advil over the next 24 hours, following this shot. You can continue taking tylenol as needed. Please follow up with Dr. Barbaraann Barthel if no improvement in 1 week

## 2017-06-15 NOTE — ED Provider Notes (Signed)
[facility] DEPT [facility] Provider Note   CSN: [id number] Arrival date & time: 06/15/17  0800     History   Chief Complaint Chief Complaint  Patient presents with  . Arm Pain    HPI Mario Simon is a 31 y.o. male.  HPI  Patient without significant past medical history presenting with less than 24 hours of the shoulder pain. Patient states that he awoke last night with shoulder pain. He denies any history of trauma. States he has never had shoulder pain like this before. Indicates that the pain is not radiating anywhere. He has taken ibuprofen without much relief overnight. He states he has limited range of motion due to the pain. Denies any numbness or tingling    Past Medical History:  Diagnosis Date  . History of dental surgery     Patient Active Problem List   Diagnosis Date Noted  . Adjustment disorder with disturbance of emotion 10/25/2015    Past Surgical History:  Procedure Laterality Date  . WISDOM TOOTH EXTRACTION         Home Medications    Prior to Admission medications   Medication Sig Start Date End Date Taking? Authorizing Provider  ibuprofen (ADVIL,MOTRIN) 800 MG tablet Take 800 mg by mouth every 8 (eight) hours as needed for moderate pain.     [provider]  naproxen (NAPROSYN) 500 MG tablet Take 500 mg by mouth 2 (two) times daily with a meal.    [provider]  omeprazole (PRILOSEC) 20 MG capsule Take 1 capsule (20 mg total) by mouth daily. Patient not taking: Reported on 10/25/2015 11/23/14   Malvin Johns, MD  sucralfate (CARAFATE) 1 G tablet Take 1 tablet (1 g total) by mouth 4 (four) times daily -  with meals and at bedtime. Patient not taking: Reported on 10/25/2015 11/23/14   Malvin Johns, MD    Family History No family history on file.  Social History Social History  Substance Use Topics  . Smoking status: Never Smoker  . Smokeless tobacco: Never Used  . Alcohol use Yes     Allergies   Patient has no known  allergies.   Review of Systems Review of Systems  Constitutional: Negative for chills and fever.  HENT: Negative for congestion.   Respiratory: Negative for cough.   Cardiovascular: Negative for chest pain.  Gastrointestinal: Negative for nausea and vomiting.  Musculoskeletal: Positive for arthralgias.  Skin: Negative for color change and rash.     Physical Exam Updated Vital Signs BP 132/89 (BP Location: Right Arm)   Pulse 63   Temp 97.6 F (36.4 C) (Oral)   Resp 18   Ht 6\' 3"  (1.905 m)   Wt 65.8 kg (145 lb)   SpO2 97%   BMI 18.12 kg/m   Physical Exam  Constitutional: He is oriented to person, place, and time. He appears well-developed and well-nourished.  HENT:  Head: Normocephalic and atraumatic.  Eyes: Conjunctivae are normal.  Neck: Normal range of motion. Neck supple.  Cardiovascular: Normal rate and regular rhythm.   Pulmonary/Chest: Effort normal and breath sounds normal.  Abdominal: Soft. Bowel sounds are normal.  Musculoskeletal:  Pain with palpation of rotator cuff, specifically supraspinatus, decreased range of motion with passive movement cannot complete full arch of motion, normal pulses bilaterally, normal sensation  Neurological: He is alert and oriented to person, place, and time.  Skin: Skin is warm. Capillary refill takes less than 2 seconds.     ED Treatments / Results  Labs (all labs ordered are listed, but only abnormal results are displayed) Labs Reviewed - No data to display  EKG  EKG Interpretation None      Radiology No results found.  Procedures Procedures (including critical care time)  Medications Ordered in ED Medications  ketorolac (TORADOL) 15 MG/ML injection 15 mg (not administered)    Initial Impression / Assessment and Plan / ED Course  I have reviewed the triage vital signs and the nursing notes. p Pertinent labs & imaging results that were available during my care of the patient were reviewed by me and  considered in my medical decision making (see chart for details).  Patient presenting with shoulder pain likely due to superaspinatus tendinopathy vs. adhesive capsulitis. Referral placed for sports medicine. Provided Toradol shot for pain  Final Clinical Impressions(s) / ED Diagnoses   Final diagnoses:  Acute pain of left shoulder    New Prescriptions New Prescriptions   No medications on file     Tonette Bihari, MD 06/15/17 Wilder, Alexander, MD 06/15/17 [phone]

## 2017-06-15 NOTE — ED Triage Notes (Addendum)
Pt reports waking up with L arm and shoulder pain. Pt reports taking advil this morning.

## 2017-06-15 NOTE — ED Notes (Signed)
ED Provider at bedside. 

## 2017-06-24 ENCOUNTER — Encounter: Payer: Self-pay | Admitting: Family Medicine

## 2017-06-24 ENCOUNTER — Ambulatory Visit (INDEPENDENT_AMBULATORY_CARE_PROVIDER_SITE_OTHER): Payer: Self-pay | Admitting: Family Medicine

## 2017-06-24 DIAGNOSIS — M79602 Pain in left arm: Secondary | ICD-10-CM

## 2017-06-24 MED ORDER — PREDNISONE 10 MG PO TABS: ORAL_TABLET | ORAL | 0 refills | Status: DC

## 2017-06-24 MED FILL — predniSONE 10 MG TABS: 10 | 6 days supply | Qty: 21 | Fill #0

## 2017-06-24 NOTE — Patient Instructions (Signed)
You suffered a stretch injury of a nerve on the left side. You have cervical radiculopathy (a pinched nerve in the neck). Prednisone 6 day dose pack to relieve irritation/inflammation of the nerve. You could start aleve OR ibuprofen after finishing the prednisone if needed. Simple range of motion exercises within limits of pain to prevent further stiffness. Consider physical therapy for stretching, exercises, traction, and modalities. Heat 15 minutes at a time 3-4 times a day to help with spasms. Watch head position when on computers, texting, when sleeping in bed - should in line with back to prevent further nerve traction and irritation. If not improving we will consider an MRI. Follow up with me in 1 month if not improving as expected.

## 2017-06-29 DIAGNOSIS — M79602 Pain in left arm: Secondary | ICD-10-CM | POA: Insufficient documentation

## 2017-06-29 NOTE — Assessment & Plan Note (Signed)
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  Start with prednisone dose pack, transition to aleve or ibuprofen.  Motion exercises.  Consider physical therapy.  Heat for spasms.  Discussed ergonomic issues.  F/u in 1 month.

## 2017-06-29 NOTE — Progress Notes (Signed)
PCP: Patient, No Pcp Per  Subjective:   HPI: Patient is a 31 y.o. male here for left arm pain.  Patient reports on 9/9 he woke up with pain on left side. Felt like it started with tenseness in left trapezius area and scapula, radiated into his upper arm. Feels weaker on this side. Pain up to 4/10 and sharp, worse at night. Was taking tylenol. No bowel/bladder dysfunction. No numbness, tingling.  Past Medical History:  Diagnosis Date  . History of dental surgery     Current Outpatient Prescriptions on File Prior to Visit  Medication Sig Dispense Refill  . omeprazole (PRILOSEC) 20 MG capsule Take 1 capsule (20 mg total) by mouth daily. (Patient not taking: Reported on 10/25/2015) 30 capsule 0  . sucralfate (CARAFATE) 1 G tablet Take 1 tablet (1 g total) by mouth 4 (four) times daily -  with meals and at bedtime. (Patient not taking: Reported on 10/25/2015) 30 tablet 0   No current facility-administered medications on file prior to visit.     Past Surgical History:  Procedure Laterality Date  . WISDOM TOOTH EXTRACTION      No Known Allergies  Social History   Social History  . Marital status: Married    Spouse name: N/A  . Number of children: N/A  . Years of education: N/A   Occupational History  . Not on file.   Social History Main Topics  . Smoking status: Never Smoker  . Smokeless tobacco: Never Used  . Alcohol use Yes  . Drug use: No  . Sexual activity: Not on file   Other Topics Concern  . Not on file   Social History Narrative  . No narrative on file    No family history on file.  BP 119/83   Pulse (!) 102   Ht 6\' 3"  (1.905 m)   Wt 145 lb (65.8 kg)   BMI 18.12 kg/m   Review of Systems: See HPI above.     Objective:  Physical Exam:  Gen: NAD, comfortable in exam room  Neck: No gross deformity, swelling, bruising. TTP left trapezius, medial to scapula.  No midline/bony TTP. FROM. BUE strength 5/5.   Sensation intact to light touch.    2+ equal reflexes in triceps, biceps, brachioradialis tendons. Negative spurlings. NV intact distal BUEs.  Left shoulder: No swelling, ecchymoses.  No gross deformity. No TTP except that noted above in neck exam. FROM. Negative Hawkins, Neers. Negative Yergasons. Strength 5/5 with empty can and resisted internal/external rotation. NV intact distally.   Assessment & Plan:  1. 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  Start with prednisone dose pack, transition to aleve or ibuprofen.  Motion exercises.  Consider physical therapy.  Heat for spasms.  Discussed ergonomic issues.  F/u in 1 month.

## 2020-10-25 ENCOUNTER — Emergency Department (HOSPITAL_BASED_OUTPATIENT_CLINIC_OR_DEPARTMENT_OTHER): Payer: 59

## 2020-10-25 ENCOUNTER — Encounter (HOSPITAL_BASED_OUTPATIENT_CLINIC_OR_DEPARTMENT_OTHER): Payer: Self-pay | Admitting: *Deleted

## 2020-10-25 ENCOUNTER — Other Ambulatory Visit: Payer: Self-pay

## 2020-10-25 ENCOUNTER — Emergency Department (HOSPITAL_BASED_OUTPATIENT_CLINIC_OR_DEPARTMENT_OTHER)
Admission: EM | Admit: 2020-10-25 | Discharge: 2020-10-25 | Disposition: A | Discharge status: Home / Self Care | Payer: 59 | Attending: Emergency Medicine | Admitting: Emergency Medicine

## 2020-10-25 DIAGNOSIS — R1084 Generalized abdominal pain: Secondary | ICD-10-CM | POA: Diagnosis not present

## 2020-10-25 LAB — COMPREHENSIVE METABOLIC PANEL
ALT: 20 U/L (ref 0–44)
AST: 19 U/L (ref 15–41)
Albumin: 4.7 g/dL (ref 3.5–5.0)
Alkaline Phosphatase: 84 U/L (ref 38–126)
Anion gap: 11 (ref 5–15)
BUN: 18 mg/dL (ref 6–20)
CO2: 25 mmol/L (ref 22–32)
Calcium: 9.5 mg/dL (ref 8.9–10.3)
Chloride: 102 mmol/L (ref 98–111)
Creatinine, Ser: 0.92 mg/dL (ref 0.61–1.24)
GFR, Estimated: >60 mL/min (ref >60)
Glucose, Bld: 99 mg/dL (ref 70–99)
Potassium: 3.7 mmol/L (ref 3.5–5.1)
Sodium: 138 mmol/L (ref 135–145)
Total Bilirubin: 0.5 mg/dL (ref 0.3–1.2)
Total Protein: 7.5 g/dL (ref 6.5–8.1)

## 2020-10-25 LAB — CBC
HCT: 45.8 % (ref 39.0–52.0)
Hemoglobin: 15.8 g/dL (ref 13.0–17.0)
MCH: 31.2 pg (ref 26.0–34.0)
MCHC: 34.5 g/dL (ref 30.0–36.0)
MCV: 90.3 fL (ref 80.0–100.0)
Platelets: 267 10*3/uL (ref 150–400)
RBC: 5.07 MIL/uL (ref 4.22–5.81)
RDW: 12.2 % (ref 11.5–15.5)
WBC: 7.5 10*3/uL (ref 4.0–10.5)
nRBC: 0 % (ref 0.0–0.2)

## 2020-10-25 LAB — LIPASE, BLOOD: Lipase: 30 U/L (ref 11–51)

## 2020-10-25 MED ORDER — DICYCLOMINE HCL 20 MG PO TABS: 20.0000 mg | ORAL_TABLET | Freq: Two times a day (BID) | ORAL | 0 refills | Status: DC

## 2020-10-25 MED ORDER — LIDOCAINE 5 % EX PTCH: 1.0000 | MEDICATED_PATCH | CUTANEOUS | Status: DC

## 2020-10-25 MED ORDER — DICYCLOMINE HCL 10 MG PO CAPS
10.0000 mg | ORAL_CAPSULE | Freq: Once | ORAL | Status: AC
  Administered 2020-10-25: 10 mg via ORAL
  Filled 2020-10-25: qty 1

## 2020-10-25 MED ORDER — LIDOCAINE 5 % EX PTCH: 1.0000 | MEDICATED_PATCH | CUTANEOUS | 0 refills | Status: DC

## 2020-10-25 MED ORDER — METHOCARBAMOL 500 MG PO TABS: 500.0000 mg | ORAL_TABLET | Freq: Two times a day (BID) | ORAL | 0 refills | Status: DC

## 2020-10-25 NOTE — ED Provider Notes (Signed)
[name] EMERGENCY DEPARTMENT Provider Note   CSN: [id number] Arrival date & time: 10/25/20  1417     History Chief Complaint  Patient presents with  . Abdominal Pain    Mario Simon is a 35 y.o. male.  HPI 35 year old male who presents to the ER with 4 days of feeling "gurgling" in his stomach, and some intermittent abdominal pain.  States that he wakes up in the morning and feels great, however started to develop some abdominal discomfort that is waxing and waning throughout the day.  He has been having normal bowel movements, not overly hard or soft, no blood.  Last bowel movement was this morning and normal.  He denies any nausea or vomiting.  He has been passing gas.  He has not taken anything for his symptoms.  Denies any fevers or chills.  He states that he has recently started a diet with his wife and has been eating a lot of salads.  He reports he does not eat much of a meal up until lunchtime, and even then only has a small sandwich as he picks his kids up from school.  He does not have any history of abdominal surgeries.  Denies any dysuria or hematuria.    Past Medical History:  Diagnosis Date  . History of dental surgery     Patient Active Problem List   Diagnosis Date Noted  . Left arm pain 06/29/2017  . Adjustment disorder with disturbance of emotion 10/25/2015    Past Surgical History:  Procedure Laterality Date  . WISDOM TOOTH EXTRACTION         No family history on file.  Social History   Tobacco Use  . Smoking status: Never Smoker  . Smokeless tobacco: Never Used  Substance Use Topics  . Alcohol use: Yes  . Drug use: No    Home Medications Prior to Admission medications   Medication Sig Start Date End Date Taking? Authorizing Provider  dicyclomine (BENTYL) 20 MG tablet Take 1 tablet (20 mg total) by mouth 2 (two) times daily. 10/25/20  Yes Garald Balding, PA-C  omeprazole (PRILOSEC) 20 MG capsule Take 1 capsule (20 mg total) by  mouth daily. Patient not taking: No sig reported 11/23/14   Malvin Johns, MD  predniSONE (DELTASONE) 10 MG tablet 6 tabs po day 1, 5 tabs po day 2, 4 tabs po day 3, 3 tabs po day 4, 2 tabs po day 5, 1 tab po day 6 06/24/17   Hudnall, Sharyn Lull, MD  sucralfate (CARAFATE) 1 G tablet Take 1 tablet (1 g total) by mouth 4 (four) times daily -  with meals and at bedtime. Patient not taking: No sig reported 11/23/14   Malvin Johns, MD    Allergies    Patient has no known allergies.  Review of Systems   Review of Systems  Constitutional: Negative for chills and fever.  HENT: Negative for ear pain and sore throat.   Eyes: Negative for pain and visual disturbance.  Respiratory: Negative for cough and shortness of breath.   Cardiovascular: Negative for chest pain and palpitations.  Gastrointestinal: Positive for abdominal pain. Negative for diarrhea, nausea and vomiting.  Genitourinary: Negative for dysuria and hematuria.  Musculoskeletal: Negative for arthralgias and back pain.  Skin: Negative for color change and rash.  Neurological: Negative for seizures and syncope.  All other systems reviewed and are negative.   Physical Exam Updated Vital Signs BP 128/87 (BP Location: Right Arm)   Pulse  76   Temp 97.9 F (36.6 C) (Oral)   Resp 18   Ht 6\' 3"  (1.905 m)   Wt 82.6 kg   SpO2 98%   BMI 22.75 kg/m   Physical Exam Vitals and nursing note reviewed.  Constitutional:      General: He is not in acute distress.    Appearance: He is well-developed and well-nourished. He is not ill-appearing, toxic-appearing or diaphoretic.  HENT:     Head: Normocephalic and atraumatic.  Eyes:     Conjunctiva/sclera: Conjunctivae normal.  Cardiovascular:     Rate and Rhythm: Normal rate and regular rhythm.     Heart sounds: Normal heart sounds. No murmur heard.   Pulmonary:     Effort: Pulmonary effort is normal. No respiratory distress.     Breath sounds: Normal breath sounds.  Abdominal:      Palpations: Abdomen is soft.     Tenderness: There is no abdominal tenderness. There is no right CVA tenderness, left CVA tenderness or guarding.     Hernia: No hernia is present.     Comments: No abdominal tenderness in all 4 quadrants on exam.  No flank tenderness.  Musculoskeletal:        General: No edema.     Cervical back: Neck supple.  Skin:    General: Skin is warm and dry.     Capillary Refill: Capillary refill takes less than 2 seconds.  Neurological:     General: No focal deficit present.     Mental Status: He is alert.  Psychiatric:        Mood and Affect: Mood and affect and mood normal.        Behavior: Behavior normal.     ED Results / Procedures / Treatments   Labs (all labs ordered are listed, but only abnormal results are displayed) Labs Reviewed  CBC  COMPREHENSIVE METABOLIC PANEL  LIPASE, BLOOD  URINALYSIS, ROUTINE W REFLEX MICROSCOPIC    EKG None  Radiology No results found.  Procedures Procedures (including critical care time)  Medications Ordered in ED Medications  dicyclomine (BENTYL) capsule 10 mg (10 mg Oral Given 10/25/20 1652)    ED Course  I have reviewed the triage vital signs and the nursing notes.  Pertinent labs & imaging results that were available during my care of the patient were reviewed by me and considered in my medical decision making (see chart for details).    MDM Rules/Calculators/A&P                          35 year old male planing of abdominal discomfort x4 days Presentation, his vitals overall reassuring, afebrile, not tachycardic tachypneic or hypoxic.  Physical exam with a benign abdominal exam, no flank tenderness.  CMP and CBC largely unremarkable, normal lipase.  No flank tenderness on exam.   Suspicion for surgical abdomen is low, as his abdomen is nontender.  He has no flank tenderness, does not have any symptoms of kidney stones or UTI.  He has no fever, chills, no signs of infectious symptoms.  I suspect  that his symptoms may be secondary due to diet changes, possible gas production.  His pain with here was relieved with Bentyl.  I do not think he needs any CT imaging at this time.  Patient is uninsured and does not have a PCP.  I encouraged him to follow-up with [facility].  We discussed return precautions, all of his questions  have been answered to his satisfaction, he voiced understanding and is agreeable.  At this stage in the ED course, the patient is medically screened and stable for discharge.     Final Clinical Impression(s) / ED Diagnoses Final diagnoses:  Generalized abdominal pain    Rx / DC Orders ED Discharge Orders         Ordered    methocarbamol (ROBAXIN) 500 MG tablet  2 times daily,   Status:  Discontinued        10/25/20 1625    lidocaine (LIDODERM) 5 %  Every 24 hours,   Status:  Discontinued        10/25/20 1625    dicyclomine (BENTYL) 20 MG tablet  2 times daily        10/25/20 1801           Lyndel Safe 10/25/20 1802    Drenda Freeze, MD 10/26/20 [phone]

## 2020-10-25 NOTE — ED Triage Notes (Signed)
Left mid abdominal pain and stomach "rumbling" for the past 4 days. Slightly constipated.

## 2020-10-25 NOTE — Discharge Instructions (Addendum)
Your work-up today was overall reassuring.  Please take the medicine prescribed as needed.  Please follow-up with [facility] which is a free clinic in the [place] area for further evaluation of your symptoms.  Return to the ER for any new or worsening symptoms.

## 2020-10-25 NOTE — ED Notes (Signed)
Patient verbalized understanding of dc instructions, prescriptions, follow up referrals and reasons to return to ER for reevaluation.  

## 2020-10-30 ENCOUNTER — Other Ambulatory Visit: Payer: Self-pay

## 2020-10-30 ENCOUNTER — Encounter (INDEPENDENT_AMBULATORY_CARE_PROVIDER_SITE_OTHER): Payer: Self-pay | Admitting: Primary Care

## 2020-10-30 ENCOUNTER — Ambulatory Visit (INDEPENDENT_AMBULATORY_CARE_PROVIDER_SITE_OTHER): Payer: 59 | Admitting: Primary Care

## 2020-10-30 VITALS — BP 123/85 | HR 84 | Temp 97.5°F | Ht 75.0 in | Wt 180.8 lb

## 2020-10-30 DIAGNOSIS — K921 Melena: Secondary | ICD-10-CM | POA: Diagnosis not present

## 2020-10-30 DIAGNOSIS — Z7689 Persons encountering health services in other specified circumstances: Secondary | ICD-10-CM

## 2020-10-30 DIAGNOSIS — K580 Irritable bowel syndrome with diarrhea: Secondary | ICD-10-CM | POA: Diagnosis not present

## 2020-10-30 NOTE — Patient Instructions (Signed)
Diverticulitis  Diverticulitis is when small pouches in your colon (large intestine) get infected or swollen. This causes pain in the belly (abdomen) and watery poop (diarrhea). These pouches are called diverticula. The pouches form in people who have a condition called diverticulosis. What are the causes? This condition may be caused by poop (stool) that gets trapped in the pouches in your colon. The poop lets germs (bacteria) grow in the pouches. This causes the infection. What increases the risk? You are more likely to get this condition if you have small pouches in your colon. The risk is higher if:  You are overweight or very overweight (obese).  You do not exercise enough.  You drink alcohol.  You smoke or use products with tobacco in them.  You eat a diet that has a lot of red meat such as beef, pork, or lamb.  You eat a diet that does not have enough fiber in it.  You are older than 34 years of age. What are the signs or symptoms?  Pain in the belly. Pain is often on the left side, but it may be in other areas.  Fever and feeling cold.  Feeling like you may vomit.  Vomiting.  Having cramps.  Feeling full.  Changes to how often you poop.  Blood in your poop. How is this treated? Most cases are treated at home by:  Taking over-the-counter pain medicines.  Following a clear liquid diet.  Taking antibiotic medicines.  Resting. Very bad cases may need to be treated at a hospital. This may include:  Not eating or drinking.  Taking prescription pain medicine.  Getting antibiotic medicines through an IV tube.  Getting fluid and food through an IV tube.  Having surgery. When you are feeling better, your doctor may tell you to have a test to check your colon (colonoscopy). Follow these instructions at home: Medicines  Take over-the-counter and prescription medicines only as told by your doctor. These include: ? Antibiotics. ? Pain medicines. ? Fiber  pills. ? Probiotics. ? Stool softeners.  If you were prescribed an antibiotic medicine, take it as told by your doctor. Do not stop taking the antibiotic even if you start to feel better.  Ask your doctor if the medicine prescribed to you requires you to avoid driving or using machinery. Eating and drinking  Follow a diet as told by your doctor.  When you feel better, your doctor may tell you to change your diet. You may need to eat a lot of fiber. Fiber makes it easier to poop (have a bowel movement). Foods with fiber include: ? Berries. ? Beans. ? Lentils. ? Green vegetables.  Avoid eating red meat.   General instructions  Do not use any products that contain nicotine or tobacco, such as cigarettes, e-cigarettes, and chewing tobacco. If you need help quitting, ask your doctor.  Exercise 3 or more times a week. Try to get 30 minutes each time. Exercise enough to sweat and make your heart beat faster.  Keep all follow-up visits as told by your doctor. This is important. Contact a doctor if:  Your pain does not get better.  You are not pooping like normal. Get help right away if:  Your pain gets worse.  Your symptoms do not get better.  Your symptoms get worse very fast.  You have a fever.  You vomit more than one time.  You have poop that is: ? Bloody. ? Black. ? Tarry. Summary  This condition happens when   small pouches in your colon get infected or swollen.  Take medicines only as told by your doctor.  Follow a diet as told by your doctor.  Keep all follow-up visits as told by your doctor. This is important. This information is not intended to replace advice given to you by your health care provider. Make sure you discuss any questions you have with your health care provider. Document Revised: 07/04/2019 Document Reviewed: 07/04/2019 Elsevier Patient Education  [place]. Diet for Irritable Bowel Syndrome When you have irritable bowel syndrome  (IBS), it is very important to eat the foods and follow the eating habits that are best for your condition. IBS may cause various symptoms such as pain in the abdomen, constipation, or diarrhea. Choosing the right foods can help to ease the discomfort from these symptoms. Work with your health care provider and diet and nutrition specialist (dietitian) to find the eating plan that will help to control your symptoms. What are tips for following this plan?  Keep a food diary. This will help you identify foods that cause symptoms. Write down: ? What you eat and when you eat it. ? What symptoms you have. ? When symptoms occur in relation to your meals, such as "pain in abdomen 2 hours after dinner."  Eat your meals slowly and in a relaxed setting.  Aim to eat 5-6 small meals per day. Do not skip meals.  Drink enough fluid to keep your urine pale yellow.  Ask your health care provider if you should take an over-the-counter probiotic to help restore healthy bacteria in your gut (digestive tract). ? Probiotics are foods that contain good bacteria and yeasts.  Your dietitian may have specific dietary recommendations for you based on your symptoms. He or she may recommend that you: ? Avoid foods that cause symptoms. Talk with your dietitian about other ways to get the same nutrients that are in those problem foods. ? Avoid foods with gluten. Gluten is a protein that is found in rye, wheat, and barley. ? Eat more foods that contain soluble fiber. Examples of foods with high soluble fiber include oats, seeds, and certain fruits and vegetables. Take a fiber supplement if directed by your dietitian. ? Reduce or avoid certain foods called FODMAPs. These are foods that contain carbohydrates that are hard to digest. Ask your doctor which foods contain these carbohydrates.      What foods are not recommended? The following are some foods and drinks that may make your symptoms worse:  Fatty foods, such as  french fries.  Foods that contain gluten, such as pasta and cereal.  Dairy products, such as milk, cheese, and ice cream.  Chocolate.  Alcohol.  Products with caffeine, such as coffee.  Carbonated drinks, such as soda.  Foods that are high in FODMAPs. These include certain fruits and vegetables.  Products with sweeteners such as honey, high fructose corn syrup, sorbitol, and mannitol. The items listed above may not be a complete list of foods and beverages you should avoid. Contact a dietitian for more information.   What foods are good sources of fiber? Your health care provider or dietitian may recommend that you eat more foods that contain fiber. Fiber can help to reduce constipation and other IBS symptoms. Add foods with fiber to your diet a little at a time so your body can get used to them. Too much fiber at one time might cause gas and swelling of your abdomen. The following are some foods that  are good sources of fiber:  Berries, such as raspberries, strawberries, and blueberries.  Tomatoes.  Carrots.  Brown rice.  Oats.  Seeds, such as chia and pumpkin seeds. The items listed above may not be a complete list of recommended sources of fiber. Contact your dietitian for more options. Where to find more information  International Foundation for Functional Gastrointestinal Disorders: www.iffgd.[employer] of Diabetes and Digestive and Kidney Diseases: [employer] Summary  When you have irritable bowel syndrome (IBS), it is very important to eat the foods and follow the eating habits that are best for your condition.  IBS may cause various symptoms such as pain in the abdomen, constipation, or diarrhea.  Choosing the right foods can help to ease the discomfort that comes from symptoms.  Keep a food diary. This will help you identify foods that cause symptoms.  Your health care provider or diet and nutrition specialist (dietitian) may recommend that  you eat more foods that contain fiber. This information is not intended to replace advice given to you by your health care provider. Make sure you discuss any questions you have with your health care provider. Document Revised: 05/24/2020 Document Reviewed: 05/24/2020 Elsevier Patient Education  2021 [employer].

## 2020-10-30 NOTE — Progress Notes (Signed)
New Patient Office Visit  Subjective:  Patient ID: Mario Simon, male    DOB: [birth year]  Age: 35 y.o. MRN: [id number]  CC:  Chief Complaint  Patient presents with  . Hospitalization Follow-up    Abdominal pain    HPI Mario Simon is a  35 y.o.male presents for follow up from the emergency room on .10/25/20, for abdominal discomfort x4 days discharged on the same day .He has diarrhea daily and tx with Bentyl in ED which he states help but interferes with working having to use the bathroom frequently .  Establishing care.  Past Medical History:  Diagnosis Date  . History of dental surgery     Past Surgical History:  Procedure Laterality Date  . WISDOM TOOTH EXTRACTION      No family history on file.  Social History   Socioeconomic History  . Marital status: Married    Spouse name: Not on file  . Number of children: Not on file  . Years of education: Not on file  . Highest education level: Not on file  Occupational History  . Not on file  Tobacco Use  . Smoking status: Never Smoker  . Smokeless tobacco: Never Used  Substance and Sexual Activity  . Alcohol use: Yes  . Drug use: No  . Sexual activity: Not on file  Other Topics Concern  . Not on file  Social History Narrative  . Not on file   Social Determinants of Health   Financial Resource Strain: Not on file  Food Insecurity: Not on file  Transportation Needs: Not on file  Physical Activity: Not on file  Stress: Not on file  Social Connections: Not on file  Intimate Partner Violence: Not on file     Past Medical History:  Diagnosis Date  . History of dental surgery      No Known Allergies    Current Outpatient Medications on File Prior to Visit  Medication Sig Dispense Refill  . dicyclomine (BENTYL) 20 MG tablet Take 1 tablet (20 mg total) by mouth 2 (two) times daily. 20 tablet 0  . predniSONE (DELTASONE) 10 MG tablet 6 tabs po day 1, 5 tabs po day 2, 4 tabs po day 3, 3 tabs po day 4, 2  tabs po day 5, 1 tab po day 6 21 tablet 0   No current facility-administered medications on file prior to visit.    ROS: all negative except above.   Physical Exam: Filed Weights   10/30/20 0924  Weight: 180 lb 12.8 oz (82 kg)   BP 123/85 (BP Location: Right Arm, Patient Position: Sitting, Cuff Size: Normal)   Pulse 84   Temp (!) 97.5 F (36.4 C) (Temporal)   Ht 6\' 3"  (1.905 m)   Wt 180 lb 12.8 oz (82 kg)   SpO2 95%   BMI 22.60 kg/m  General Appearance: Well nourished, in no apparent distress. Eyes: PERRLA, EOMs, conjunctiva no swelling or erythema Sinuses: No Frontal/maxillary tenderness ENT/Mouth: Ext aud canals clear, TMs without erythema, bulging.Marland Kitchen Hearing normal.  Neck: Supple, thyroid normal.  Respiratory: Respiratory effort normal, BS equal bilaterally without rales, rhonchi, wheezing or stridor.  Cardio: RRR with no MRGs. Brisk peripheral pulses without edema.  Abdomen: Soft, + BS.  Non tender, no guarding, rebound, hernias, masses. Lymphatics: Non tender without lymphadenopathy.  Musculoskeletal: Full ROM, 5/5 strength, normal gait.  Skin: Warm, dry without rashes, lesions, ecchymosis.  Neuro: Cranial nerves intact. Normal muscle tone, no cerebellar symptoms. Sensation intact.  Psych: Awake and oriented X 3, normal affect, Insight and Judgment appropriate.    Mario Simon was seen today for hospitalization follow-up.  Diagnoses and all orders for this visit:  Encounter to establish care  Establish care with PCP  Irritable bowel syndrome with diarrhea differential diverticulitis  -     Ambulatory referral to Gastroenterology  Blood in stool -     Ambulatory referral to Gastroenterology

## 2020-10-30 NOTE — Progress Notes (Signed)
Pt states pain starts in the afternoon and lasts until bedtime- only relief is sitting upright

## 2020-10-31 ENCOUNTER — Ambulatory Visit (INDEPENDENT_AMBULATORY_CARE_PROVIDER_SITE_OTHER): Payer: Self-pay

## 2020-10-31 ENCOUNTER — Encounter (HOSPITAL_BASED_OUTPATIENT_CLINIC_OR_DEPARTMENT_OTHER): Payer: Self-pay | Admitting: *Deleted

## 2020-10-31 ENCOUNTER — Emergency Department (HOSPITAL_BASED_OUTPATIENT_CLINIC_OR_DEPARTMENT_OTHER)
Admission: EM | Admit: 2020-10-31 | Discharge: 2020-11-01 | Disposition: A | Discharge status: Home / Self Care | Payer: 59 | Attending: Emergency Medicine | Admitting: Emergency Medicine

## 2020-10-31 ENCOUNTER — Other Ambulatory Visit: Payer: Self-pay

## 2020-10-31 ENCOUNTER — Emergency Department (HOSPITAL_BASED_OUTPATIENT_CLINIC_OR_DEPARTMENT_OTHER): Payer: 59

## 2020-10-31 DIAGNOSIS — K2970 Gastritis, unspecified, without bleeding: Secondary | ICD-10-CM

## 2020-10-31 DIAGNOSIS — K5281 Eosinophilic gastritis or gastroenteritis: Secondary | ICD-10-CM | POA: Insufficient documentation

## 2020-10-31 DIAGNOSIS — D72829 Elevated white blood cell count, unspecified: Secondary | ICD-10-CM | POA: Diagnosis not present

## 2020-10-31 DIAGNOSIS — D721 Eosinophilia, unspecified: Secondary | ICD-10-CM

## 2020-10-31 DIAGNOSIS — R109 Unspecified abdominal pain: Secondary | ICD-10-CM | POA: Diagnosis present

## 2020-10-31 LAB — CBC WITH DIFFERENTIAL/PLATELET
Abs Immature Granulocytes: 0.03 10*3/uL (ref 0.00–0.07)
Basophils Absolute: 0.1 10*3/uL (ref 0.0–0.1)
Basophils Relative: 1 %
Eosinophils Absolute: 2.3 10*3/uL — ABNORMAL HIGH (ref 0.0–0.5)
Eosinophils Relative: 18 %
HCT: 46 % (ref 39.0–52.0)
Hemoglobin: 15.9 g/dL (ref 13.0–17.0)
Immature Granulocytes: 0 %
Lymphocytes Relative: 16 %
Lymphs Abs: 2 10*3/uL (ref 0.7–4.0)
MCH: 30.9 pg (ref 26.0–34.0)
MCHC: 34.6 g/dL (ref 30.0–36.0)
MCV: 89.5 fL (ref 80.0–100.0)
Monocytes Absolute: 0.8 10*3/uL (ref 0.1–1.0)
Monocytes Relative: 6 %
Neutro Abs: 7.6 10*3/uL (ref 1.7–7.7)
Neutrophils Relative %: 59 %
Platelets: 289 10*3/uL (ref 150–400)
RBC: 5.14 MIL/uL (ref 4.22–5.81)
RDW: 12 % (ref 11.5–15.5)
WBC Morphology: ABNORMAL
WBC: 12.9 10*3/uL — ABNORMAL HIGH (ref 4.0–10.5)
nRBC: 0 % (ref 0.0–0.2)

## 2020-10-31 LAB — COMPREHENSIVE METABOLIC PANEL
ALT: 37 U/L (ref 0–44)
AST: 26 U/L (ref 15–41)
Albumin: 4.7 g/dL (ref 3.5–5.0)
Alkaline Phosphatase: 86 U/L (ref 38–126)
Anion gap: 10 (ref 5–15)
BUN: 17 mg/dL (ref 6–20)
CO2: 24 mmol/L (ref 22–32)
Calcium: 9.3 mg/dL (ref 8.9–10.3)
Chloride: 100 mmol/L (ref 98–111)
Creatinine, Ser: 0.97 mg/dL (ref 0.61–1.24)
GFR, Estimated: >60 mL/min (ref >60)
Glucose, Bld: 115 mg/dL — ABNORMAL HIGH (ref 70–99)
Potassium: 4.1 mmol/L (ref 3.5–5.1)
Sodium: 134 mmol/L — ABNORMAL LOW (ref 135–145)
Total Bilirubin: 0.9 mg/dL (ref 0.3–1.2)
Total Protein: 7.4 g/dL (ref 6.5–8.1)

## 2020-10-31 LAB — URINALYSIS, ROUTINE W REFLEX MICROSCOPIC
Bilirubin Urine: NEGATIVE
Glucose, UA: NEGATIVE mg/dL
Hgb urine dipstick: NEGATIVE
Ketones, ur: NEGATIVE mg/dL
Leukocytes,Ua: NEGATIVE
Nitrite: NEGATIVE
Protein, ur: NEGATIVE mg/dL
Specific Gravity, Urine: 1.01 (ref 1.005–1.030)
pH: 5 (ref 5.0–8.0)

## 2020-10-31 LAB — LIPASE, BLOOD: Lipase: 34 U/L (ref 11–51)

## 2020-10-31 MED ORDER — ONDANSETRON 4 MG PO TBDP: 4.0000 mg | ORAL_TABLET | Freq: Three times a day (TID) | ORAL | 0 refills | Status: DC | PRN

## 2020-10-31 MED ORDER — ONDANSETRON HCL 4 MG/2ML IJ SOLN
4.0000 mg | Freq: Once | INTRAMUSCULAR | Status: AC
  Administered 2020-10-31: 4 mg via INTRAVENOUS
  Filled 2020-10-31: qty 2

## 2020-10-31 MED ORDER — FENTANYL CITRATE (PF) 100 MCG/2ML IJ SOLN
50.0000 ug | Freq: Once | INTRAMUSCULAR | Status: AC
  Administered 2020-10-31: 50 ug via INTRAVENOUS
  Filled 2020-10-31: qty 2

## 2020-10-31 MED ORDER — IOHEXOL 300 MG/ML  SOLN
100.0000 mL | Freq: Once | INTRAMUSCULAR | Status: AC | PRN
  Administered 2020-10-31: 100 mL via INTRAVENOUS

## 2020-10-31 MED ORDER — PANTOPRAZOLE SODIUM 40 MG PO TBEC: 40.0000 mg | DELAYED_RELEASE_TABLET | Freq: Every day | ORAL | 0 refills | Status: DC

## 2020-10-31 NOTE — Telephone Encounter (Signed)
Patient called crying in pain and says the left side of his abdomen hurts, 10/10 pain scale. He says it doesn't move anywhere else, denies any other symptoms other than the pain. Advised to go to the ED for evaluation, he verbalized understanding.  Reason for Disposition . [1] SEVERE pain (e.g., excruciating) AND [2] present > 1 hour  Answer Assessment - Initial Assessment Questions 1. LOCATION: "Where does it hurt?"      Left side 2. RADIATION: "Does the pain shoot anywhere else?" (e.g., chest, back)     No 3. ONSET: "When did the pain begin?" (Minutes, hours or days ago)      This morning when woke up 4. SUDDEN: "Gradual or sudden onset?"     Gradual 5. PATTERN "Does the pain come and go, or is it constant?"    - If constant: "Is it getting better, staying the same, or worsening?"      (Note: Constant means the pain never goes away completely; most serious pain is constant and it progresses)     - If intermittent: "How long does it last?" "Do you have pain now?"     (Note: Intermittent means the pain goes away completely between bouts)     Worsening 6. SEVERITY: "How bad is the pain?"  (e.g., Scale 1-10; mild, moderate, or severe)    - MILD (1-3): doesn't interfere with normal activities, abdomen soft and not tender to touch     - MODERATE (4-7): interferes with normal activities or awakens from sleep, tender to touch     - SEVERE (8-10): excruciating pain, doubled over, unable to do any normal activities       10 7. RECURRENT SYMPTOM: "Have you ever had this type of stomach pain before?" If Yes, ask: "When was the last time?" and "What happened that time?"      Not this strong 8. CAUSE: "What do you think is causing the stomach pain?"     I don't know 9. RELIEVING/AGGRAVATING FACTORS: "What makes it better or worse?" (e.g., movement, antacids, bowel movement)     N/A 10. OTHER SYMPTOMS: "Has there been any vomiting, diarrhea, constipation, or urine problems?"       No  Protocols  used: ABDOMINAL PAIN - MALE-A-AH

## 2020-10-31 NOTE — Discharge Instructions (Signed)
Please schedule follow-up appointment with your primary doctor as well as with gastroenterology.  You should have a complete blood count with differential completed in around 1 week or so to monitor your white blood cell count and eosinophil count.

## 2020-10-31 NOTE — ED Triage Notes (Signed)
D pain x 6 days , Ed visit 1/20 and followed by PMD , scheduled to see GI, returns today for abd pain

## 2020-10-31 NOTE — ED Notes (Signed)
Patient transported to CT 

## 2020-10-31 NOTE — ED Provider Notes (Signed)
[facility] Provider Note   CSN: [id number] Arrival date & time: 10/31/20  1856     History Chief Complaint  Patient presents with  . Abdominal Pain    Mario Simon is a 35 y.o. male.  Presenting to ER with concern for abdominal pain.  Patient reports that over the last few days has been having intermittent abdominal pain.  Describes it as a crampy aching sensation, worse on his left side.  Has had some associated nausea but no vomiting.  Some loose stools, noted small blood in stool as well.  No blood today.  Had some improvement with Bentyl provided in ER but otherwise does not feel like anything seems to be helping.  No fevers.  Went to primary care office yesterday and they recommended following up with a gastroenterologist.  HPI     Past Medical History:  Diagnosis Date  . History of dental surgery     Patient Active Problem List   Diagnosis Date Noted  . Left arm pain 06/29/2017  . Adjustment disorder with disturbance of emotion 10/25/2015    Past Surgical History:  Procedure Laterality Date  . WISDOM TOOTH EXTRACTION         No family history on file.  Social History   Tobacco Use  . Smoking status: Never Smoker  . Smokeless tobacco: Never Used  Substance Use Topics  . Alcohol use: Yes  . Drug use: No    Home Medications Prior to Admission medications   Medication Sig Start Date End Date Taking? Authorizing Provider  dicyclomine (BENTYL) 20 MG tablet Take 1 tablet (20 mg total) by mouth 2 (two) times daily. 10/25/20   Garald Balding, PA-C  predniSONE (DELTASONE) 10 MG tablet 6 tabs po day 1, 5 tabs po day 2, 4 tabs po day 3, 3 tabs po day 4, 2 tabs po day 5, 1 tab po day 6 06/24/17   Hudnall, Sharyn Lull, MD    Allergies    Patient has no known allergies.  Review of Systems   Review of Systems  Constitutional: Negative for chills and fever.  HENT: Negative for ear pain and sore throat.   Eyes: Negative for pain and  visual disturbance.  Respiratory: Negative for cough and shortness of breath.   Cardiovascular: Negative for chest pain and palpitations.  Gastrointestinal: Positive for abdominal pain, blood in stool, diarrhea and nausea. Negative for vomiting.  Genitourinary: Negative for dysuria and hematuria.  Musculoskeletal: Negative for arthralgias and back pain.  Skin: Negative for color change and rash.  Neurological: Negative for seizures and syncope.  All other systems reviewed and are negative.   Physical Exam Updated Vital Signs BP (!) 145/86 (BP Location: Right Arm)   Pulse 72   Temp 98.2 F (36.8 C)   Resp 20   Ht 6\' 3"  (1.905 m)   Wt 81.6 kg   SpO2 99%   BMI 22.50 kg/m   Physical Exam Vitals and nursing note reviewed.  Constitutional:      Appearance: He is well-developed and well-nourished.  HENT:     Head: Normocephalic and atraumatic.  Eyes:     Conjunctiva/sclera: Conjunctivae normal.  Cardiovascular:     Rate and Rhythm: Normal rate and regular rhythm.     Heart sounds: No murmur heard.   Pulmonary:     Effort: Pulmonary effort is normal. No respiratory distress.     Breath sounds: Normal breath sounds.  Abdominal:  Palpations: Abdomen is soft.     Comments: Tenderness in left upper quadrant but no rebound or guarding  Musculoskeletal:        General: No edema.     Cervical back: Neck supple.  Skin:    General: Skin is warm and dry.  Neurological:     Mental Status: He is alert.  Psychiatric:        Mood and Affect: Mood and affect normal.     ED Results / Procedures / Treatments   Labs (all labs ordered are listed, but only abnormal results are displayed) Labs Reviewed  CBC WITH DIFFERENTIAL/PLATELET - Abnormal; Notable for the following components:      Result Value   WBC 12.9 (*)    Eosinophils Absolute 2.3 (*)    All other components within normal limits  COMPREHENSIVE METABOLIC PANEL - Abnormal; Notable for the following components:    Sodium 134 (*)    Glucose, Bld 115 (*)    All other components within normal limits  LIPASE, BLOOD  PATHOLOGIST SMEAR REVIEW  URINALYSIS, ROUTINE W REFLEX MICROSCOPIC    EKG None  Radiology CT ABDOMEN PELVIS W CONTRAST  Result Date: 10/31/2020 CLINICAL DATA:  Left abdominal pain, blood in stool EXAM: CT ABDOMEN AND PELVIS WITH CONTRAST TECHNIQUE: Multidetector CT imaging of the abdomen and pelvis was performed using the standard protocol following bolus administration of intravenous contrast. CONTRAST:  117mL OMNIPAQUE IOHEXOL 300 MG/ML  SOLN COMPARISON:  None. FINDINGS: Lower chest: Lung bases are clear. No effusions. Heart is normal size. Hepatobiliary: No focal hepatic abnormality. Gallbladder unremarkable. Pancreas: No focal abnormality or ductal dilatation. Spleen: No focal abnormality.  Normal size. Adrenals/Urinary Tract: No adrenal abnormality. No focal renal abnormality. No stones or hydronephrosis. Urinary bladder is unremarkable. Stomach/Bowel: Stomach, large and small bowel grossly unremarkable. Normal appendix. Vascular/Lymphatic: No evidence of aneurysm or adenopathy. Reproductive: No visible focal abnormality. Other: No free fluid or free air. Musculoskeletal: No acute bony abnormality. IMPRESSION: No acute findings in the abdomen or pelvis. Electronically Signed   By: Rolm Baptise M.D.   On: 10/31/2020 22:14    Procedures Procedures   Medications Ordered in ED Medications  fentaNYL (SUBLIMAZE) injection 50 mcg (50 mcg Intravenous Given 10/31/20 2149)  ondansetron (ZOFRAN) injection 4 mg (4 mg Intravenous Given 10/31/20 2149)  iohexol (OMNIPAQUE) 300 MG/ML solution 100 mL (100 mLs Intravenous Contrast Given 10/31/20 2203)    ED Course  I have reviewed the triage vital signs and the nursing notes.  Pertinent labs & imaging results that were available during my care of the patient were reviewed by me and considered in my medical decision making (see chart for details).     MDM Rules/Calculators/A&P                          35 year old male presenting to ER with concern for abdominal pain, nausea and loose stools.  On physical exam he was he was noted to be well-appearing with stable vitals, his abdomen was soft, minimal tenderness was noted left side.  Lab work noted for mild leukocytosis, eosinophilia.  CT abdomen pelvis negative for any acute abdominopelvic pathology.  UA negative for infection.  Suspect acute GI illness versus.  Eosinophilic gastrointestinal disease.  Believe patient would benefit from evaluation by gastroenterology.  Recommend bland diet, trial of PPI.  Recommended close follow-up with primary and GI, repeat CBC with diff.  Discussed CBC with lab, they sent off for path  review which will come back tomorrow.     After the discussed management above, the patient was determined to be safe for discharge.  The patient was in agreement with this plan and all questions regarding their care were answered.  ED return precautions were discussed and the patient will return to the ED with any significant worsening of condition.    Final Clinical Impression(s) / ED Diagnoses Final diagnoses:  None    Rx / DC Orders ED Discharge Orders    None       Lucrezia Starch, MD 11/01/20 0005

## 2020-11-02 LAB — PATHOLOGIST SMEAR REVIEW

## 2020-11-15 ENCOUNTER — Other Ambulatory Visit: Payer: Self-pay

## 2020-11-15 ENCOUNTER — Encounter: Payer: Self-pay | Admitting: Gastroenterology

## 2020-11-15 ENCOUNTER — Ambulatory Visit (INDEPENDENT_AMBULATORY_CARE_PROVIDER_SITE_OTHER): Payer: 59 | Admitting: Gastroenterology

## 2020-11-15 VITALS — BP 124/82 | HR 67 | Ht 75.0 in | Wt 178.0 lb

## 2020-11-15 DIAGNOSIS — R197 Diarrhea, unspecified: Secondary | ICD-10-CM | POA: Diagnosis not present

## 2020-11-15 DIAGNOSIS — R109 Unspecified abdominal pain: Secondary | ICD-10-CM | POA: Diagnosis not present

## 2020-11-15 DIAGNOSIS — K625 Hemorrhage of anus and rectum: Secondary | ICD-10-CM | POA: Diagnosis not present

## 2020-11-15 MED ORDER — PLENVU 140 G PO SOLR: 1.0000 | Freq: Once | ORAL | 0 refills | Status: AC

## 2020-11-15 MED ORDER — HYOSCYAMINE SULFATE 0.125 MG SL SUBL: 0.1250 mg | SUBLINGUAL_TABLET | SUBLINGUAL | 0 refills | Status: AC | PRN

## 2020-11-15 NOTE — Patient Instructions (Signed)
If you are age 34 or older, your body mass index should be between 23-30. Your Body mass index is 22.25 kg/m. If this is out of the aforementioned range listed, please consider follow up with your Primary Care Provider.  If you are age 50 or younger, your body mass index should be between 19-25. Your Body mass index is 22.25 kg/m. If this is out of the aformentioned range listed, please consider follow up with your Primary Care Provider.   We have sent the following medications to your pharmacy for you to pick up at your convenience: Levsin 0.125 mg every 6 hours as needed.   You have been scheduled for an endoscopy and colonoscopy. Please follow the written instructions given to you at your visit today. Please pick up your prep supplies at the pharmacy within the next 1-3 days. If you use inhalers (even only as needed), please bring them with you on the day of your procedure.  Due to recent changes in healthcare laws, you may see the results of your imaging and laboratory studies on MyChart before your provider has had a chance to review them.  We understand that in some cases there may be results that are confusing or concerning to you. Not all laboratory results come back in the same time frame and the provider may be waiting for multiple results in order to interpret others.  Please give [place] 48 hours in order for your provider to thoroughly review all the results before contacting the office for clarification of your results.

## 2020-11-15 NOTE — Progress Notes (Signed)
11/15/2020 Mario Simon [id number] [birth year]   HISTORY OF PRESENT ILLNESS: This is a pleasant 35 year old male with essentially no past medical history.  He tells me that a couple of months ago he developed left-sided abdominal pain.  Initially when it began he is having diarrhea and rectal bleeding, but those symptoms have resolved and now his bowels move regularly once or twice a day without blood.  The left side abdominal pain is still present, however, and sometimes gets very severe.  He says that prior to the onset of the symptoms he does not recall ever having any GI issues except for some issues with acid reflux in the past.  He says that the last episode of severe abdominal pain that he had when he went to the emergency department on January 26 was intolerable.  At that time his white blood cell count was elevated at about 13K and his absolute eosinophil count was elevated.  Lipase and CMP were unremarkable.  He had a CT scan of the abdomen and pelvis with contrast that did not show any cause of his symptoms.  He says that he has been told that he may have IBS, diverticulitis, eosinophilic gastroenteritis, etc.  He is also had no history of asthma, allergies, eczema or other atopic issues.    He was referred here by Mario Mire, NP.  Past Medical History:  Diagnosis Date  . History of dental surgery    Past Surgical History:  Procedure Laterality Date  . WISDOM TOOTH EXTRACTION      reports that he has never smoked. He has never used smokeless tobacco. He reports current alcohol use. He reports that he does not use drugs. family history includes Colon cancer in his maternal grandfather and maternal grandmother. No Known Allergies    Outpatient Encounter Medications as of 11/15/2020  Medication Sig  . pantoprazole (PROTONIX) 40 MG tablet Take 1 tablet (40 mg total) by mouth daily.  . [DISCONTINUED] dicyclomine (BENTYL) 20 MG tablet Take 1 tablet (20 mg total) by mouth 2  (two) times daily.  . [DISCONTINUED] ondansetron (ZOFRAN ODT) 4 MG disintegrating tablet Take 1 tablet (4 mg total) by mouth every 8 (eight) hours as needed for nausea or vomiting.  . [DISCONTINUED] predniSONE (DELTASONE) 10 MG tablet 6 tabs po day 1, 5 tabs po day 2, 4 tabs po day 3, 3 tabs po day 4, 2 tabs po day 5, 1 tab po day 6   No facility-administered encounter medications on file as of 11/15/2020.    REVIEW OF SYSTEMS  : All other systems reviewed and negative except where noted in the History of Present Illness.   PHYSICAL EXAM: BP 124/82   Pulse 67   Ht 6\' 3"  (1.905 m)   Wt 178 lb (80.7 kg)   BMI 22.25 kg/m  General: Well developed male in no acute distress Head: Normocephalic and atraumatic Eyes:  Sclerae anicteric, conjunctiva pink. Ears: Normal auditory acuity Lungs: Clear throughout to auscultation; no W/R/R. Heart: Regular rate and rhythm; no M/R/G. Abdomen: Soft, non-distended.  BS present.  Mild left mid-abdominal TTP. Rectal:  Will be done at the time of colonoscopy. Musculoskeletal: Symmetrical with no gross deformities  Skin: No lesions on visible extremities Extremities: No edema  Neurological: Alert oriented x 4, grossly non-focal Psychological:  Alert and cooperative. Normal mood and affect  ASSESSMENT AND PLAN: *35 year old male with complaints of left mid abdominal pain for the past couple of months.  He says that  initially when this began he had diarrhea and rectal bleeding as well, but those symptoms have resolved.  He had an elevated white blood cell count as well as an elevated absolute eosinophil count on his labs.  CMP and lipase were normal.  CT scan of the abdomen and pelvis with contrast did not show any cause of his symptoms.  He says that the pain was absolutely intolerable at his last ED visit when the CT scan was performed.  He has been given diagnoses such as IBS although does not have any history of GI issues, diverticulitis which was not seen  on CT scan, and also eosinophilic gastroenteritis.  Differential diagnoses also includes Crohn's/ulcerative colitis.  He would like an EGD and colonoscopy to get to the bottom of the symptoms.  Will schedule these with Dr. Loletha Carrow.  In the interim for his abdominal pain he certainly can use Tylenol or heating pad and I will prescribe Levsin for him to use every 6 hours as needed.  Prescription sent to pharmacy.   CC:  Kerin Perna, NP

## 2020-11-20 ENCOUNTER — Telehealth: Payer: Self-pay | Admitting: *Deleted

## 2020-11-20 NOTE — Progress Notes (Signed)
____________________________________________________________  Attending physician addendum:  Thank you for sending this case to me. I have reviewed the entire note and agree with the plan.  Not yet clear how or if the eosinophilia present on second ED visit relates to symptoms.  Agree with endoscopic testing.  Wilfrid Lund, MD  ____________________________________________________________

## 2020-11-20 NOTE — Telephone Encounter (Signed)
-----   Message from Loralie Champagne, PA-C sent at 11/15/2020  4:53 PM EST ----- We please contact him and let him know that I would like him to discontinue his pantoprazole around March 9, about 2 weeks prior to his procedure.  I discussed this with him, but was not sure when his procedure was scheduled for and forgot to have you make mention of it in his after visit summary.  Thank you,  Jess

## 2020-11-21 NOTE — Telephone Encounter (Signed)
Left message for patient to call office.  

## 2020-12-25 ENCOUNTER — Telehealth: Payer: Self-pay | Admitting: Gastroenterology

## 2020-12-25 NOTE — Telephone Encounter (Signed)
Patient called back to check on status for prep.  Per CMA, will have Plenvu sample upfront for patient to pickup today; patient informed.

## 2020-12-25 NOTE — Telephone Encounter (Signed)
Pt states that his pharmacy still does not have Plenvu. They kept telling him to come back the following day but nothing. Pt's procedure is tomorrow at 3:30pm. He wants to know if he can have a different prep or if he needs to r/s. Pls call him.

## 2020-12-26 ENCOUNTER — Other Ambulatory Visit: Payer: Self-pay

## 2020-12-26 ENCOUNTER — Ambulatory Visit (AMBULATORY_SURGERY_CENTER): Payer: 59 | Admitting: Gastroenterology

## 2020-12-26 ENCOUNTER — Other Ambulatory Visit (INDEPENDENT_AMBULATORY_CARE_PROVIDER_SITE_OTHER): Payer: 59

## 2020-12-26 ENCOUNTER — Encounter: Payer: Self-pay | Admitting: Gastroenterology

## 2020-12-26 ENCOUNTER — Other Ambulatory Visit: Payer: Self-pay | Admitting: Gastroenterology

## 2020-12-26 VITALS — BP 117/77 | HR 73 | Temp 98.0°F | Resp 12 | Ht 75.0 in | Wt 178.0 lb

## 2020-12-26 DIAGNOSIS — R197 Diarrhea, unspecified: Secondary | ICD-10-CM

## 2020-12-26 DIAGNOSIS — K625 Hemorrhage of anus and rectum: Secondary | ICD-10-CM

## 2020-12-26 DIAGNOSIS — K2100 Gastro-esophageal reflux disease with esophagitis, without bleeding: Secondary | ICD-10-CM

## 2020-12-26 DIAGNOSIS — K5730 Diverticulosis of large intestine without perforation or abscess without bleeding: Secondary | ICD-10-CM | POA: Diagnosis not present

## 2020-12-26 DIAGNOSIS — K2970 Gastritis, unspecified, without bleeding: Secondary | ICD-10-CM

## 2020-12-26 DIAGNOSIS — D123 Benign neoplasm of transverse colon: Secondary | ICD-10-CM

## 2020-12-26 DIAGNOSIS — R1032 Left lower quadrant pain: Secondary | ICD-10-CM

## 2020-12-26 DIAGNOSIS — K200 Eosinophilic esophagitis: Secondary | ICD-10-CM | POA: Diagnosis not present

## 2020-12-26 LAB — CBC WITH DIFFERENTIAL/PLATELET
Basophils Absolute: 0.1 10*3/uL (ref 0.0–0.1)
Basophils Relative: 0.6 % (ref 0.0–3.0)
Eosinophils Absolute: 0.1 10*3/uL (ref 0.0–0.7)
Eosinophils Relative: 1.6 % (ref 0.0–5.0)
HCT: 44 % (ref 39.0–52.0)
Hemoglobin: 15 g/dL (ref 13.0–17.0)
Lymphocytes Relative: 14.2 % (ref 12.0–46.0)
Lymphs Abs: 1.3 10*3/uL (ref 0.7–4.0)
MCHC: 34.1 g/dL (ref 30.0–36.0)
MCV: 92.4 fl (ref 78.0–100.0)
Monocytes Absolute: 0.6 10*3/uL (ref 0.1–1.0)
Monocytes Relative: 6 % (ref 3.0–12.0)
Neutro Abs: 7.2 10*3/uL (ref 1.4–7.7)
Neutrophils Relative %: 77.6 % — ABNORMAL HIGH (ref 43.0–77.0)
Platelets: 230 10*3/uL (ref 150.0–400.0)
RBC: 4.76 Mil/uL (ref 4.22–5.81)
RDW: 13.3 % (ref 11.5–15.5)
WBC: 9.3 10*3/uL (ref 4.0–10.5)

## 2020-12-26 MED ORDER — PANTOPRAZOLE SODIUM 40 MG PO TBEC: 40.0000 mg | DELAYED_RELEASE_TABLET | Freq: Every day | ORAL | 1 refills | Status: AC

## 2020-12-26 MED ORDER — SODIUM CHLORIDE 0.9 % IV SOLN: 500.0000 mL | Freq: Once | INTRAVENOUS | Status: DC

## 2020-12-26 NOTE — Op Note (Signed)
[facility] Patient Name: Mario Simon Procedure Date: 12/26/2020 2:06 PM MRN: [id number] Endoscopist: Mallie Mussel L. Loletha Carrow , MD Age: 35 Referring MD:  Date of Birth: Jan 15, 1986 Gender: Male Account #: [id number] Procedure:                Colonoscopy Indications:              Abdominal pain in the left lower quadrant,                            Diarrhea, Rectal bleeding                           Severe LLQ pain, diarrhea and rectal bleeding 2                            months ago. Diarrhea/bleeding resolved, pain                            continued. Patient did not take hyoscyamine after                            office visit due to cost. However, all symptoms now                            reportedly resolved.                           peripheral eosinophilia noted on ED labs. Patient                            denies rash or angioedema. Medicines:                Monitored Anesthesia Care Procedure:                Pre-Anesthesia Assessment:                           - Prior to the procedure, a History and Physical                            was performed, and patient medications and                            allergies were reviewed. The patient's tolerance of                            previous anesthesia was also reviewed. The risks                            and benefits of the procedure and the sedation                            options and risks were discussed with the patient.  All questions were answered, and informed consent                            was obtained. Prior Anticoagulants: The patient has                            taken no previous anticoagulant or antiplatelet                            agents. ASA Grade Assessment: II - A patient with                            mild systemic disease. After reviewing the risks                            and benefits, the patient was deemed in                            satisfactory condition  to undergo the procedure.                           - Prior to the procedure, a History and Physical                            was performed, and patient medications and                            allergies were reviewed. The patient's tolerance of                            previous anesthesia was also reviewed. The risks                            and benefits of the procedure and the sedation                            options and risks were discussed with the patient.                            All questions were answered, and informed consent                            was obtained. Prior Anticoagulants: The patient has                            taken no previous anticoagulant or antiplatelet                            agents. ASA Grade Assessment: II - A patient with                            mild systemic disease. After reviewing the risks  and benefits, the patient was deemed in                            satisfactory condition to undergo the procedure.                           After obtaining informed consent, the colonoscope                            was passed under direct vision. Throughout the                            procedure, the patient's blood pressure, pulse, and                            oxygen saturations were monitored continuously. The                            Colonoscope was introduced through the anus and                            advanced to the the terminal ileum, with                            identification of the appendiceal orifice and IC                            valve. The colonoscopy was performed without                            difficulty. The patient tolerated the procedure                            well. The quality of the bowel preparation was                            excellent. The terminal ileum, ileocecal valve,                            appendiceal orifice, and rectum were photographed. Scope In: 3:02:08  PM Scope Out: 3:11:44 PM Scope Withdrawal Time: 0 hours 6 minutes 58 seconds  Total Procedure Duration: 0 hours 9 minutes 36 seconds  Findings:                 The perianal and digital rectal examinations were                            normal.                           The terminal ileum appeared normal.                           A few small-mouthed diverticula were found in the  left colon.                           Normal mucosa was found in the entire colon.                            Biopsies were taken with a cold forceps for                            histology (ascending,transverse, descending to r/o                            eosinophilic colitis).                           The exam was otherwise without abnormality on                            direct and retroflexion views. Complications:            No immediate complications. Estimated Blood Loss:     Estimated blood loss was minimal. Impression:               - The examined portion of the ileum was normal.                           - Diverticulosis in the left colon.                           - Normal mucosa in the entire examined colon.                            Biopsied.                           - The examination was otherwise normal on direct                            and retroflexion views. Recommendation:           - Patient has a contact number available for                            emergencies. The signs and symptoms of potential                            delayed complications were discussed with the                            patient. Return to normal activities tomorrow.                            Written discharge instructions were provided to the                            patient.                           -  Resume previous diet.                           - Continue present medications.                           - Await pathology results.                           - No  recommendation at this time regarding repeat                            colonoscopy due to young age.                           - See the other procedure note for documentation of                            additional recommendations.                           - CBC with differential today Cashawn Yanko L. Loletha Carrow, MD 12/26/2020 3:23:15 PM This report has been signed electronically.

## 2020-12-26 NOTE — Op Note (Signed)
[place] Patient Name: Mario Simon Procedure Date: 12/26/2020 2:05 PM MRN: [id number] Endoscopist: Robinette. 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 The patient's tolerance of                            previous anesthesia was also reviewed. The risks                            and benefits of the procedure and the sedation                            options and risks were discussed with the patient.                            All questions were answered, and informed consent                            was obtained. Prior Anticoagulants: The patient has                            taken no previous anticoagulant or antiplatelet                            agents. ASA Grade Assessment: II - A patient with                            mild systemic disease. After reviewing the risks                            and benefits, the patient was deemed in                            satisfactory condition to undergo the procedure.  After obtaining informed consent, the endoscope was                            passed under direct vision. Throughout the                            procedure, the patient's blood pressure, pulse, and                            oxygen saturations were monitored continuously. The                            Endoscope was introduced through the mouth, and                            advanced to the second part of duodenum. The upper                             GI endoscopy was accomplished without difficulty.                            The patient tolerated the procedure well. Scope In: Scope Out: Findings:                 The larynx was normal.                           One superficial esophageal ulcer with scant contact                            bleeding was found at the gastroesophageal                            junction. The lesion was 6 mm in largest dimension.                           Diffuse mucosal changes characterized by                            longitudinal markings were found in the lower third                            of the esophagus. Biopsies were taken with a cold                            forceps for histology from the mid and distal                            esophagus.                           The entire examined stomach was normal. Biopsies                            were taken with a  cold forceps for histology (to                            rule out eosinophilic gastritis).                           The examined duodenum was normal. Biopsies were                            taken with a cold forceps for histology (to rule of                            eosinophilic enteritis). Complications:            No immediate complications. Estimated Blood Loss:     Estimated blood loss was minimal. Impression:               - Normal larynx.                           - Esophageal ulcer with scant contact bleeding.                           - Longitudinally marked mucosa in the esophagus.                            Biopsied.                           - Normal stomach. Biopsied.                           - Normal examined duodenum. Biopsied. Recommendation:           - Patient has a contact number available for                            emergencies. The signs and symptoms of potential                            delayed complications were discussed with the                            patient. Return  to normal activities tomorrow.                            Written discharge instructions were provided to the                            patient.                           - Resume previous diet.                           - Continue present medications (pantoprazole once  daily).                           - Await pathology results.                           - See the other procedure note for documentation of                            additional recommendations.                           - Return to my office after studies are complete. Ozell Ferrera L. Loletha Carrow, MD 12/26/2020 3:18:22 PM This report has been signed electronically.

## 2020-12-26 NOTE — Progress Notes (Signed)
Report given to PACU, vss 

## 2020-12-26 NOTE — Progress Notes (Signed)
Called to room to assist during endoscopic procedure.  Patient ID and intended procedure confirmed with present staff. Received instructions for my participation in the procedure from the performing physician.  

## 2020-12-26 NOTE — Patient Instructions (Signed)
Handouts given for Esophagitis and Diverticulosis.  Await pathology results.  Go to the lab for bloodwork today before you leave.  YOU HAD AN ENDOSCOPIC PROCEDURE TODAY AT [facility]:   Refer to the procedure report that was given to you for any specific questions about what was found during the examination.  If the procedure report does not answer your questions, please call your gastroenterologist to clarify.  If you requested that your care partner not be given the details of your procedure findings, then the procedure report has been included in a sealed envelope for you to review at your convenience later.  YOU SHOULD EXPECT: Some feelings of bloating in the abdomen. Passage of more gas than usual.  Walking can help get rid of the air that was put into your GI tract during the procedure and reduce the bloating. If you had a lower endoscopy (such as a colonoscopy or flexible sigmoidoscopy) you may notice spotting of blood in your stool or on the toilet paper. If you underwent a bowel prep for your procedure, you may not have a normal bowel movement for a few days.  Please Note:  You might notice some irritation and congestion in your nose or some drainage.  This is from the oxygen used during your procedure.  There is no need for concern and it should clear up in a day or so.  SYMPTOMS TO REPORT IMMEDIATELY:   Following lower endoscopy (colonoscopy or flexible sigmoidoscopy):  Excessive amounts of blood in the stool  Significant tenderness or worsening of abdominal pains  Swelling of the abdomen that is new, acute  Fever of 100F or higher   Following upper endoscopy (EGD)  Vomiting of blood or coffee ground material  New chest pain or pain under the shoulder blades  Painful or persistently difficult swallowing  New shortness of breath  Fever of 100F or higher  Black, tarry-looking stools  For urgent or emergent issues, a gastroenterologist can be reached at any  hour by calling [phone]. Do not use MyChart messaging for urgent concerns.    DIET:  We do recommend a small meal at first, but then you may proceed to your regular diet.  Drink plenty of fluids but you should avoid alcoholic beverages for 24 hours.  ACTIVITY:  You should plan to take it easy for the rest of today and you should NOT DRIVE or use heavy machinery until tomorrow (because of the sedation medicines used during the test).    FOLLOW UP: Our staff will call the number listed on your records 48-72 hours following your procedure to check on you and address any questions or concerns that you may have regarding the information given to you following your procedure. If we do not reach you, we will leave a message.  We will attempt to reach you two times.  During this call, we will ask if you have developed any symptoms of COVID 19. If you develop any symptoms (ie: fever, flu-like symptoms, shortness of breath, cough etc.) before then, please call [phone].  If you test positive for Covid 19 in the 2 weeks post procedure, please call and report this information to [place].    If any biopsies were taken you will be contacted by phone or by letter within the next 1-3 weeks.  Please call us at [phone] if you have not heard about the biopsies in 3 weeks.    SIGNATURES/CONFIDENTIALITY: You and/or your care partner have signed paperwork  which will be entered into your electronic medical record.  These signatures attest to the fact that that the information above on your After Visit Summary has been reviewed and is understood.  Full responsibility of the confidentiality of this discharge information lies with you and/or your care-partner.

## 2020-12-26 NOTE — Progress Notes (Signed)
VS by CW  I have reviewed the patient's medical history in detail and updated the computerized patient record.  

## 2020-12-26 NOTE — Progress Notes (Signed)
1444 Robinul 0.1 mg IV given due large amount of secretions upon assessment.  MD made aware, vss

## 2020-12-28 ENCOUNTER — Telehealth: Payer: Self-pay

## 2020-12-28 ENCOUNTER — Telehealth: Payer: Self-pay | Admitting: *Deleted

## 2020-12-28 NOTE — Telephone Encounter (Signed)
  Follow up Call-  Call back number 12/26/2020  Post procedure Call Back phone  # [phone]  Permission to leave phone message Yes  Some recent data might be hidden     Patient questions:  Do you have a fever, pain , or abdominal swelling? No. Pain Score  0 *  Have you tolerated food without any problems? Yes.    Have you been able to return to your normal activities? Yes.    Do you have any questions about your discharge instructions: Diet   No. Medications  No. Follow up visit  No.  Do you have questions or concerns about your Care? No.  Actions: * If pain score is 4 or above: No action needed, pain <4.  1. Have you developed a fever since your procedure? no  2.   Have you had an respiratory symptoms (SOB or cough) since your procedure? no  3.   Have you tested positive for COVID 19 since your procedure no  4.   Have you had any family members/close contacts diagnosed with the COVID 19 since your procedure?  no   If yes to any of these questions please route to Joylene John, RN and Joella Prince, RN

## 2020-12-28 NOTE — Telephone Encounter (Signed)
Attempted to reach pt. With follow-up call following endoscopic procedure 12/26/2020.  Pt.'s voice mailbox full, unable to LM.  Will try to reach pt. Again later today.

## 2020-12-28 NOTE — Telephone Encounter (Signed)
Per 12/26/20 procedure note - Return to my office after studies are complete  Patient is scheduled for a follow up with Dr. Loletha Carrow on Monday, 02/04/21 at 3:40 PM. Letter mailed to patient with appointment information.

## 2021-02-04 ENCOUNTER — Ambulatory Visit: Payer: 59 | Admitting: Gastroenterology

## 2022-09-30 IMAGING — CT CT ABD-PELV W/ CM
2 of 4 series · 17 of 46 positions shown, 19 images · IV contrast (Omnipaque)
Comparison: None.

CLINICAL DATA: Left abdominal pain, blood in stool

EXAM:
CT ABDOMEN AND PELVIS WITH CONTRAST
TECHNIQUE: Multidetector CT imaging of the abdomen and pelvis was performed
using the standard protocol following bolus administration of
intravenous contrast.
CONTRAST:  100mL OMNIPAQUE IOHEXOL 300 MG/ML  SOLN

[Series 2: axial st · axial · 0.73mm/px · z∈[-550,-120]mm · 14 of 96 slices shown, 16 images]
[im 5/96  soft-tissue]
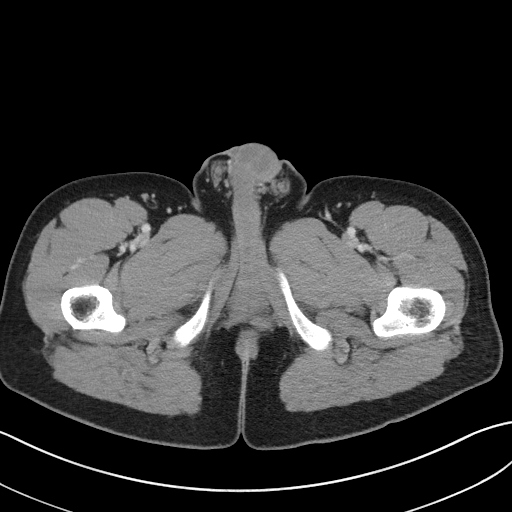
[im 5/96  bone]
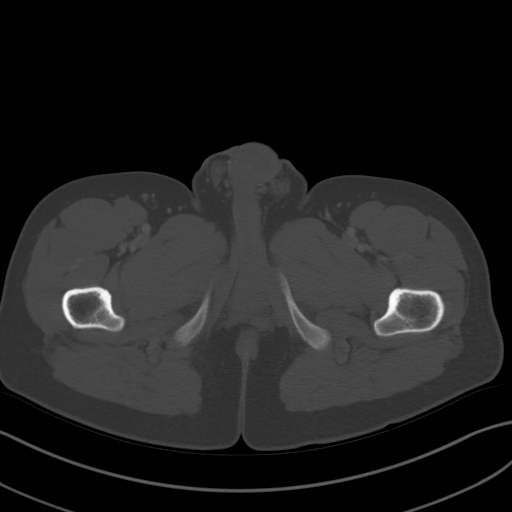
[im 13/96  soft-tissue]
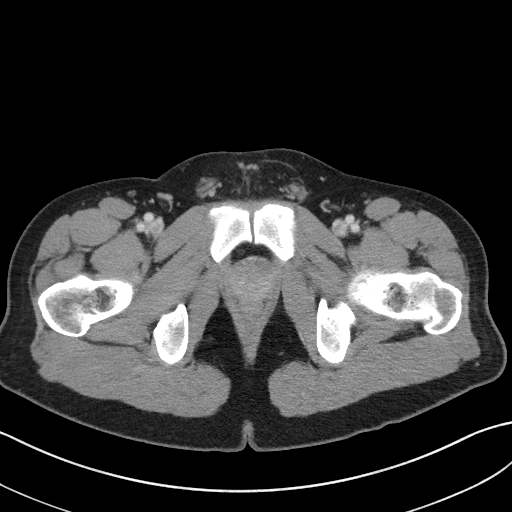
[im 18/96  soft-tissue]
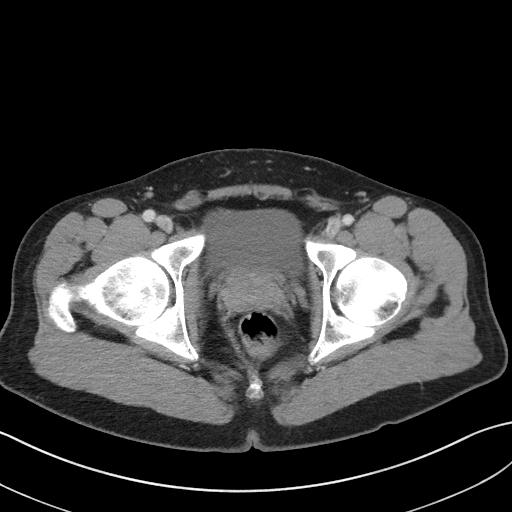
[im 26/96  soft-tissue]
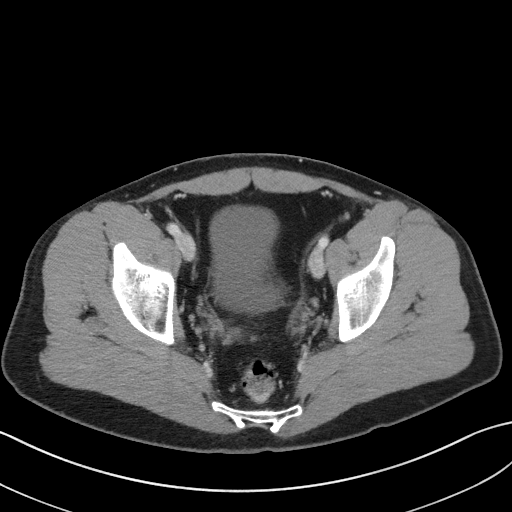
[im 31/96  soft-tissue]
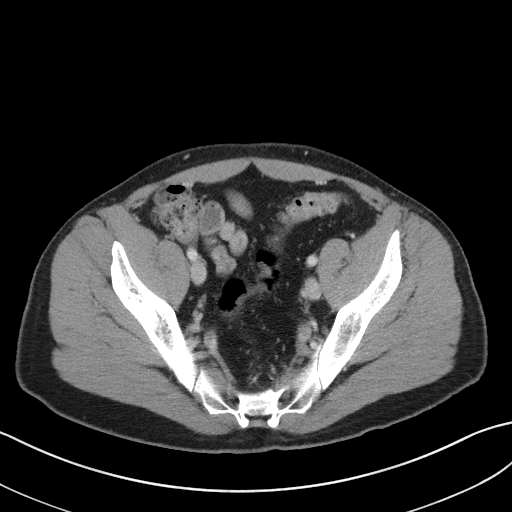
[im 39/96  soft-tissue]
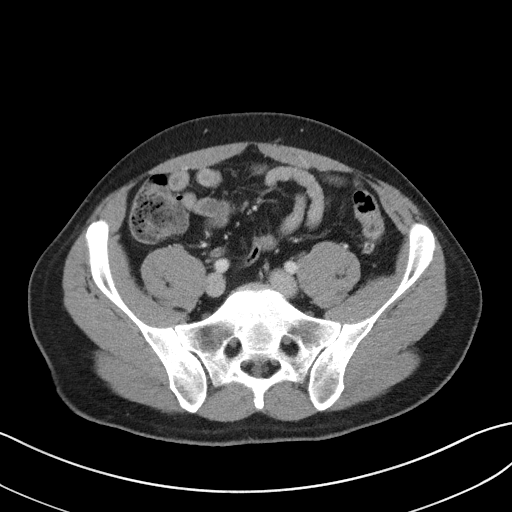
[im 44/96  soft-tissue]
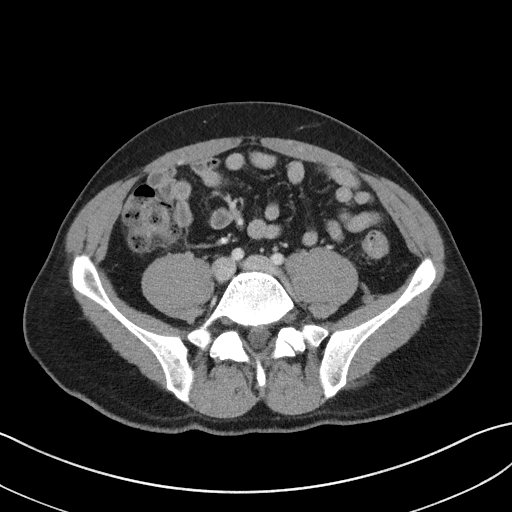
[im 52/96  soft-tissue]
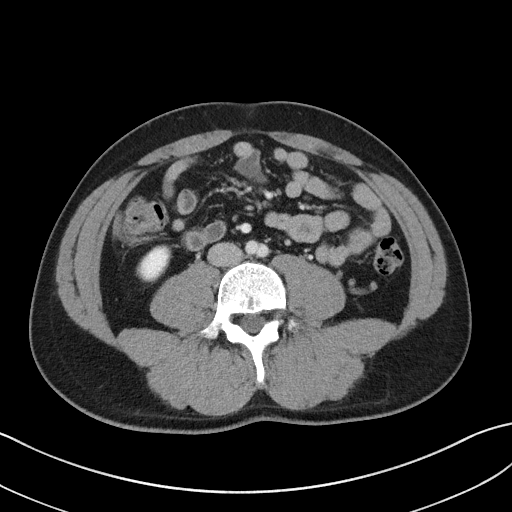
[im 57/96  soft-tissue]
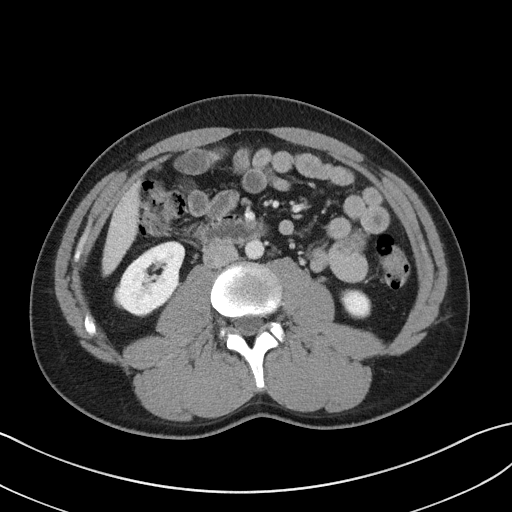
[im 57/96  bone]
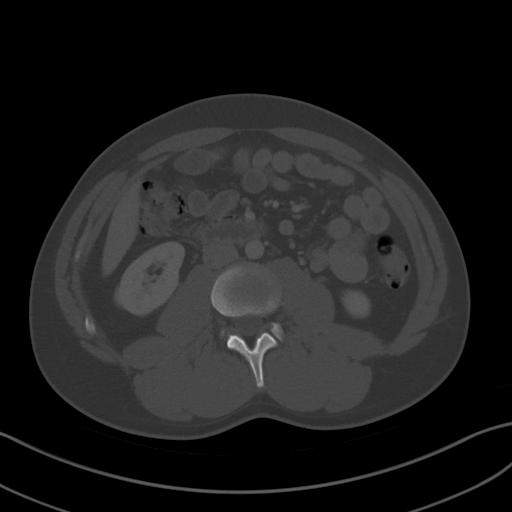
[im 65/96  soft-tissue]
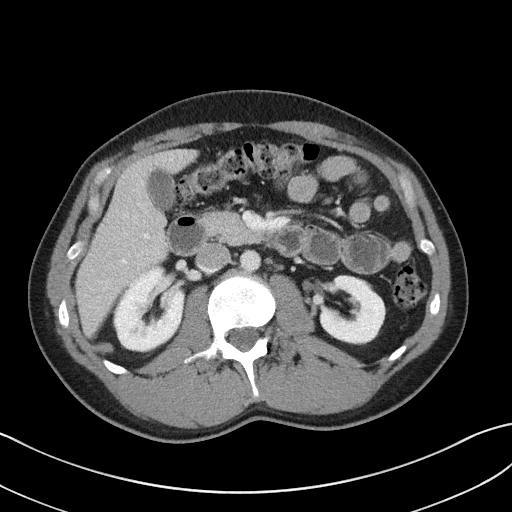
[im 70/96  soft-tissue]
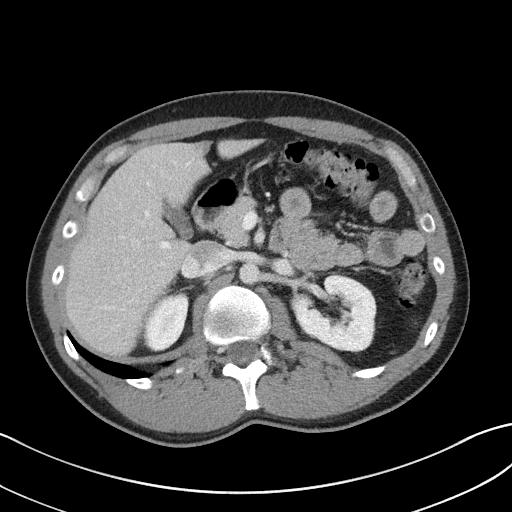
[im 78/96  soft-tissue]
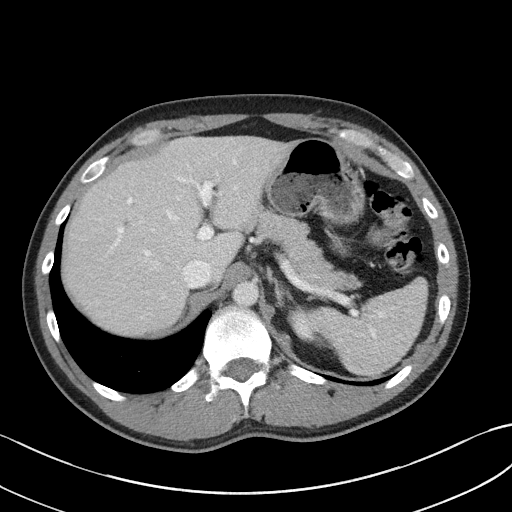
[im 83/96  soft-tissue]
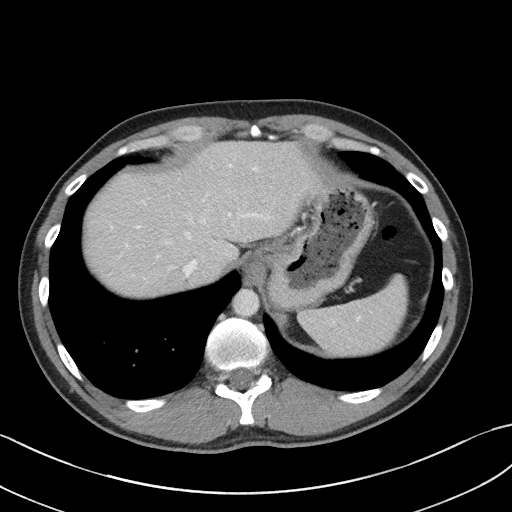
[im 91/96  soft-tissue]
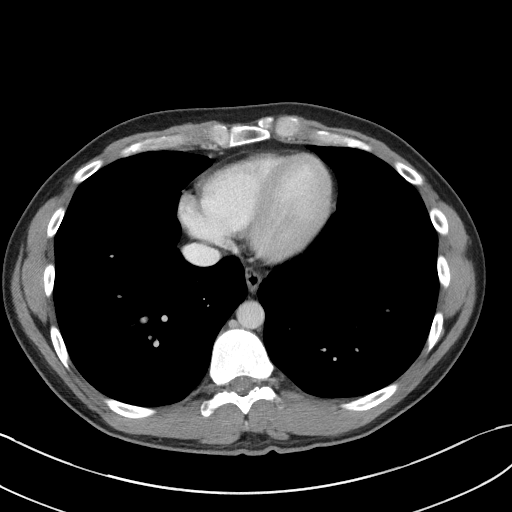

[Series 5: coronal st · coronal · 0.79mm/px · 3 of 82 slices shown]
[im 28/82  soft-tissue]
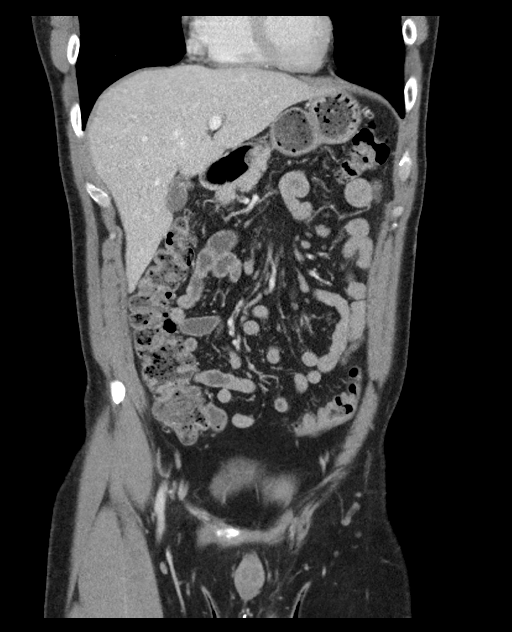
[im 37/82  soft-tissue]
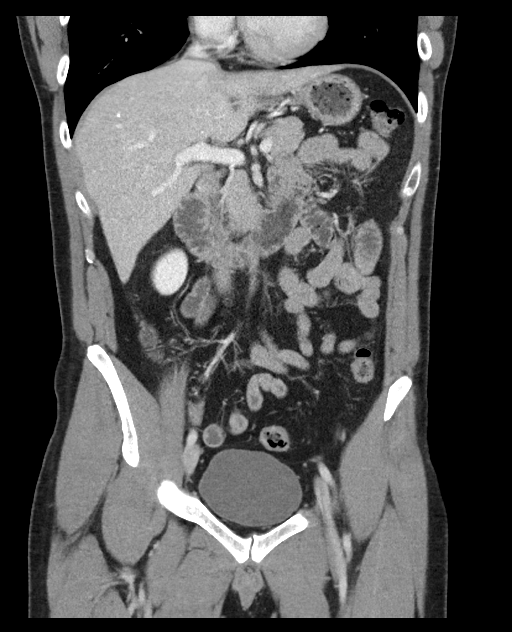
[im 46/82  soft-tissue]
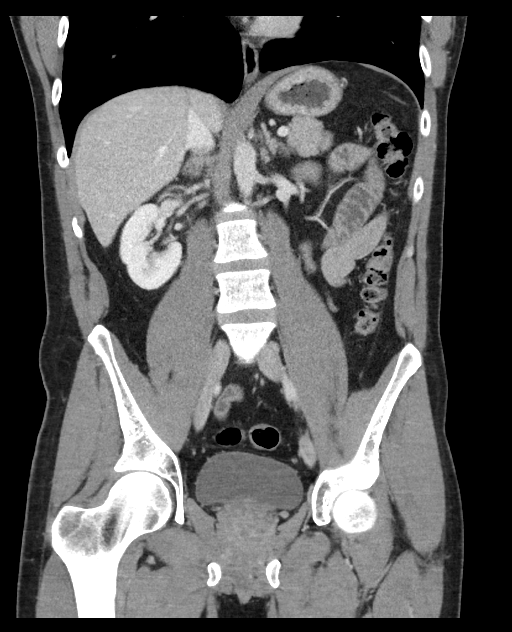

[17 of 46 positions shown; findings below may reference images not displayed]

FINDINGS: Lower chest: Lung bases are clear. No effusions. Heart is normal
size.

Hepatobiliary: No focal hepatic abnormality. Gallbladder
unremarkable.

Pancreas: No focal abnormality or ductal dilatation.

Spleen: No focal abnormality.  Normal size.

Adrenals/Urinary Tract: No adrenal abnormality. No focal renal
abnormality. No stones or hydronephrosis. Urinary bladder is
unremarkable.

Stomach/Bowel: Stomach, large and small bowel grossly unremarkable.
Normal appendix.

Vascular/Lymphatic: No evidence of aneurysm or adenopathy.

Reproductive: No visible focal abnormality.

Other: No free fluid or free air.

Musculoskeletal: No acute bony abnormality.
IMPRESSION: No acute findings in the abdomen or pelvis.

## 2024-07-04 ENCOUNTER — Other Ambulatory Visit: Payer: Self-pay

## 2024-07-04 ENCOUNTER — Emergency Department (HOSPITAL_BASED_OUTPATIENT_CLINIC_OR_DEPARTMENT_OTHER)

## 2024-07-04 ENCOUNTER — Encounter (HOSPITAL_BASED_OUTPATIENT_CLINIC_OR_DEPARTMENT_OTHER): Payer: Self-pay | Admitting: Emergency Medicine

## 2024-07-04 ENCOUNTER — Emergency Department (HOSPITAL_BASED_OUTPATIENT_CLINIC_OR_DEPARTMENT_OTHER)
Admission: EM | Admit: 2024-07-04 | Discharge: 2024-07-04 | Disposition: A | Discharge status: Home / Self Care | Attending: Emergency Medicine | Admitting: Emergency Medicine

## 2024-07-04 DIAGNOSIS — K5732 Diverticulitis of large intestine without perforation or abscess without bleeding: Secondary | ICD-10-CM | POA: Diagnosis not present

## 2024-07-04 DIAGNOSIS — R1084 Generalized abdominal pain: Secondary | ICD-10-CM

## 2024-07-04 DIAGNOSIS — K5792 Diverticulitis of intestine, part unspecified, without perforation or abscess without bleeding: Secondary | ICD-10-CM

## 2024-07-04 DIAGNOSIS — R112 Nausea with vomiting, unspecified: Secondary | ICD-10-CM | POA: Diagnosis present

## 2024-07-04 LAB — CBC
HCT: 48.9 % (ref 39.0–52.0)
Hemoglobin: 16.9 g/dL (ref 13.0–17.0)
MCH: 31.7 pg (ref 26.0–34.0)
MCHC: 34.6 g/dL (ref 30.0–36.0)
MCV: 91.7 fL (ref 80.0–100.0)
Platelets: 193 K/uL (ref 150–400)
RBC: 5.33 MIL/uL (ref 4.22–5.81)
RDW: 12 % (ref 11.5–15.5)
WBC: 13.7 K/uL — ABNORMAL HIGH (ref 4.0–10.5)
nRBC: 0 % (ref 0.0–0.2)

## 2024-07-04 LAB — COMPREHENSIVE METABOLIC PANEL WITH GFR
ALT: 28 U/L (ref 0–44)
AST: 18 U/L (ref 15–41)
Albumin: 4.7 g/dL (ref 3.5–5.0)
Alkaline Phosphatase: 96 U/L (ref 38–126)
Anion gap: 15 (ref 5–15)
BUN: 13 mg/dL (ref 6–20)
CO2: 23 mmol/L (ref 22–32)
Calcium: 9.5 mg/dL (ref 8.9–10.3)
Chloride: 103 mmol/L (ref 98–111)
Creatinine, Ser: 0.8 mg/dL (ref 0.61–1.24)
GFR, Estimated: >60 mL/min (ref >60)
Glucose, Bld: 111 mg/dL — ABNORMAL HIGH (ref 70–99)
Potassium: 3.7 mmol/L (ref 3.5–5.1)
Sodium: 140 mmol/L (ref 135–145)
Total Bilirubin: 1.5 mg/dL — ABNORMAL HIGH (ref 0.0–1.2)
Total Protein: 7.5 g/dL (ref 6.5–8.1)

## 2024-07-04 LAB — LIPASE, BLOOD: Lipase: 23 U/L (ref 11–51)

## 2024-07-04 MED ORDER — OXYCODONE-ACETAMINOPHEN 5-325 MG PO TABS: 1.0000 | ORAL_TABLET | Freq: Four times a day (QID) | ORAL | 0 refills | Status: AC | PRN

## 2024-07-04 MED ORDER — MORPHINE SULFATE (PF) 4 MG/ML IV SOLN
4.0000 mg | Freq: Once | INTRAVENOUS | Status: AC
  Administered 2024-07-04: 4 mg via INTRAVENOUS
  Filled 2024-07-04: qty 1

## 2024-07-04 MED ORDER — PANTOPRAZOLE SODIUM 40 MG IV SOLR
40.0000 mg | Freq: Once | INTRAVENOUS | Status: AC
  Administered 2024-07-04: 40 mg via INTRAVENOUS
  Filled 2024-07-04: qty 10

## 2024-07-04 MED ORDER — IOHEXOL 300 MG/ML  SOLN
100.0000 mL | Freq: Once | INTRAMUSCULAR | Status: AC | PRN
  Administered 2024-07-04: 100 mL via INTRAVENOUS

## 2024-07-04 MED ORDER — PIPERACILLIN-TAZOBACTAM 3.375 G IVPB 30 MIN
3.3750 g | Freq: Once | INTRAVENOUS | Status: AC
  Administered 2024-07-04: 3.375 g via INTRAVENOUS
  Filled 2024-07-04: qty 50

## 2024-07-04 MED ORDER — LACTATED RINGERS IV BOLUS
1000.0000 mL | Freq: Once | INTRAVENOUS | Status: AC
  Administered 2024-07-04: 1000 mL via INTRAVENOUS

## 2024-07-04 MED ORDER — ONDANSETRON 8 MG PO TBDP: 8.0000 mg | ORAL_TABLET | Freq: Three times a day (TID) | ORAL | 0 refills | Status: DC | PRN

## 2024-07-04 MED ORDER — ONDANSETRON HCL 4 MG/2ML IJ SOLN
4.0000 mg | Freq: Once | INTRAMUSCULAR | Status: AC
  Administered 2024-07-04: 4 mg via INTRAVENOUS
  Filled 2024-07-04: qty 2

## 2024-07-04 MED ORDER — AMOXICILLIN-POT CLAVULANATE 875-125 MG PO TABS: 1.0000 | ORAL_TABLET | Freq: Two times a day (BID) | ORAL | 0 refills | Status: DC

## 2024-07-04 NOTE — ED Provider Notes (Signed)
 [place] [facility] AT [facility] Provider Note   CSN: [id number] Arrival date & time: 07/04/24  1000     Patient presents with: Abdominal Pain   Mario Simon is a 38 y.o. male.   Pt c/o upper abd pain and nausea/vomiting. Symptoms present in past day, constant, dull, non radiating, not pleuritic, without specific exacerbating or alleviating factors.  Had episode of emesis a couple weeks ago, but none in past few days.  No fever/chills. No known bad food ingestion or ill contacts. Denies recent similar recurrent symptoms. Denies hx pud/gastritis - although upper endoscopy from 4 yrs ago showed mild gastritis/esophagitis. Also hx diverticula on prior colonoscopy. No lower abd pain. No melena or rectal bleeding. No back or flank pain. No gu c/o.   The history is provided by the patient and medical records.  Abdominal Pain Associated symptoms: nausea and vomiting   Associated symptoms: no chest pain, no chills, no constipation, no cough, no diarrhea, no dysuria, no fever, no shortness of breath and no sore throat        Prior to Admission medications   Medication Sig Start Date End Date Taking? Authorizing Provider  amoxicillin-clavulanate (AUGMENTIN) 875-125 MG tablet Take 1 tablet by mouth every 12 (twelve) hours. 07/04/24  Yes Bernard Drivers, MD  ondansetron  (ZOFRAN -ODT) 8 MG disintegrating tablet Take 1 tablet (8 mg total) by mouth every 8 (eight) hours as needed for nausea or vomiting. 07/04/24  Yes Bernard Drivers, MD  oxyCODONE-acetaminophen  (PERCOCET/ROXICET) 5-325 MG tablet Take 1 tablet by mouth every 6 (six) hours as needed for severe pain (pain score 7-10). 07/04/24  Yes Bernard Drivers, MD  hyoscyamine  (LEVSIN  SL) 0.125 MG SL tablet Place 1 tablet (0.125 mg total) under the tongue every 4 (four) hours as needed. Patient not taking: Reported on 12/26/2020 11/15/20   Zehr, Jessica D, PA-C  pantoprazole  (PROTONIX ) 40 MG tablet Take 1 tablet (40 mg total) by mouth  daily. 12/26/20   Legrand Victory LITTIE DOUGLAS, MD    Allergies: Patient has no known allergies.    Review of Systems  Constitutional:  Negative for chills and fever.  HENT:  Negative for sore throat.   Respiratory:  Negative for cough and shortness of breath.   Cardiovascular:  Negative for chest pain and leg swelling.  Gastrointestinal:  Positive for abdominal pain, nausea and vomiting. Negative for blood in stool, constipation and diarrhea.  Genitourinary:  Negative for dysuria, flank pain, scrotal swelling and testicular pain.  Musculoskeletal:  Negative for back pain.  Skin:  Negative for rash.  Neurological:  Negative for headaches.    Updated Vital Signs BP 127/71   Pulse 70   Temp 97.6 F (36.4 C) (Oral)   Resp 16   Wt 72.6 kg   SpO2 100%   BMI 20.00 kg/m   Physical Exam Vitals and nursing note reviewed.  Constitutional:      Appearance: Normal appearance. He is well-developed.  HENT:     Head: Atraumatic.     Nose: Nose normal.     Mouth/Throat:     Mouth: Mucous membranes are moist.     Pharynx: Oropharynx is clear. No oropharyngeal exudate or posterior oropharyngeal erythema.  Eyes:     General: No scleral icterus.    Conjunctiva/sclera: Conjunctivae normal.  Neck:     Trachea: No tracheal deviation.  Cardiovascular:     Rate and Rhythm: Normal rate and regular rhythm.     Pulses: Normal pulses.  Heart sounds: Normal heart sounds. No murmur heard.    No friction rub. No gallop.  Pulmonary:     Effort: Pulmonary effort is normal. No accessory muscle usage or respiratory distress.     Breath sounds: Normal breath sounds.  Abdominal:     General: Bowel sounds are normal. There is no distension.     Palpations: Abdomen is soft. There is no mass.     Tenderness: There is abdominal tenderness. There is no guarding or rebound.     Hernia: No hernia is present.     Comments: Mid to upper abd tenderness.   Genitourinary:    Comments: No cva  tenderness. Musculoskeletal:        General: No swelling or tenderness.     Cervical back: Normal range of motion and neck supple. No rigidity.     Right lower leg: No edema.     Left lower leg: No edema.  Skin:    General: Skin is warm and dry.     Findings: No rash.  Neurological:     Mental Status: He is alert.     Comments: Alert, speech clear.   Psychiatric:        Mood and Affect: Mood normal.     (all labs ordered are listed, but only abnormal results are displayed) Results for orders placed or performed during the hospital encounter of 07/04/24  Lipase, blood   Collection Time: 07/04/24 10:25 AM  Result Value Ref Range   Lipase 23 11 - 51 U/L  Comprehensive metabolic panel   Collection Time: 07/04/24 10:25 AM  Result Value Ref Range   Sodium 140 135 - 145 mmol/L   Potassium 3.7 3.5 - 5.1 mmol/L   Chloride 103 98 - 111 mmol/L   CO2 23 22 - 32 mmol/L   Glucose, Bld 111 (H) 70 - 99 mg/dL   BUN 13 6 - 20 mg/dL   Creatinine, Ser 9.19 0.61 - 1.24 mg/dL   Calcium 9.5 8.9 - 89.6 mg/dL   Total Protein 7.5 6.5 - 8.1 g/dL   Albumin 4.7 3.5 - 5.0 g/dL   AST 18 15 - 41 U/L   ALT 28 0 - 44 U/L   Alkaline Phosphatase 96 38 - 126 U/L   Total Bilirubin 1.5 (H) 0.0 - 1.2 mg/dL   GFR, Estimated >39 >39 mL/min   Anion gap 15 5 - 15  CBC   Collection Time: 07/04/24 10:25 AM  Result Value Ref Range   WBC 13.7 (H) 4.0 - 10.5 K/uL   RBC 5.33 4.22 - 5.81 MIL/uL   Hemoglobin 16.9 13.0 - 17.0 g/dL   HCT 51.0 60.9 - 47.9 %   MCV 91.7 80.0 - 100.0 fL   MCH 31.7 26.0 - 34.0 pg   MCHC 34.6 30.0 - 36.0 g/dL   RDW 87.9 88.4 - 84.4 %   Platelets 193 150 - 400 K/uL   nRBC 0.0 0.0 - 0.2 %      EKG: None  Radiology: CT ABDOMEN PELVIS W CONTRAST Result Date: 07/04/2024 CLINICAL DATA:  Abdominal pain. Dysuria. Episode of vomiting black and bright red material 2 weeks ago. EXAM: CT ABDOMEN AND PELVIS WITH CONTRAST TECHNIQUE: Multidetector CT imaging of the abdomen and pelvis was  performed using the standard protocol following bolus administration of intravenous contrast. RADIATION DOSE REDUCTION: This exam was performed according to the departmental dose-optimization program which includes automated exposure control, adjustment of the mA and/or kV according to patient size and/or  use of iterative reconstruction technique. CONTRAST:  OMNIPAQUE  IOHEXOL  300 MG/ML  SOLN COMPARISON:  10/31/2020 FINDINGS: Lower chest: Heart is normal size.  Lung bases are clear. Hepatobiliary: Liver, gallbladder and biliary tree are normal. Pancreas: Normal. Spleen: Normal. Adrenals/Urinary Tract: Adrenal glands are normal. Kidneys are normal in size without nephrolithiasis or focal mass. No significant hydronephrosis. Ureters and bladder are normal. Stomach/Bowel: Stomach and small bowel are unremarkable. Appendix is normal. There is diverticulosis throughout the colon. There is moderate focal inflammatory change adjacent a diverticula along the right lateral aspect of the sigmoid colon in the pelvis just left of midline. No definitive free peritoneal air and no evidence of diverticular abscess. Minimal free fluid in the pelvis. Vascular/Lymphatic: Abdominal aorta is normal in caliber. Remaining vascular structures are unremarkable. No adenopathy. Reproductive: Prostate is unremarkable. Other: None. Musculoskeletal: No focal abnormality. IMPRESSION: Moderate acute diverticulitis of the sigmoid colon in the pelvis just left of midline. No evidence of diverticular abscess or free peritoneal air. Minimal free fluid in the pelvis. Electronically Signed   By: Toribio Agreste M.D.   On: 07/04/2024 12:11     Procedures   Medications Ordered in the ED  lactated ringers bolus 1,000 mL (0 mLs Intravenous Stopped 07/04/24 1201)  morphine  (PF) 4 MG/ML injection 4 mg (4 mg Intravenous Given 07/04/24 1042)  ondansetron  (ZOFRAN ) injection 4 mg (4 mg Intravenous Given 07/04/24 1034)  pantoprazole  (PROTONIX ) injection  40 mg (40 mg Intravenous Given 07/04/24 1036)  iohexol  (OMNIPAQUE ) 300 MG/ML solution 100 mL (100 mLs Intravenous Contrast Given 07/04/24 1129)  piperacillin-tazobactam (ZOSYN) IVPB 3.375 g (0 g Intravenous Stopped 07/04/24 1316)                                    Medical Decision Making Problems Addressed: Acute diverticulitis: acute illness or injury with systemic symptoms that poses a threat to life or bodily functions Generalized abdominal pain: acute illness or injury with systemic symptoms that poses a threat to life or bodily functions  Amount and/or Complexity of Data Reviewed Independent Historian:     Details: Family, hx External Data Reviewed: notes. Labs: ordered. Decision-making details documented in ED Course. Radiology: ordered and independent interpretation performed. Decision-making details documented in ED Course.  Risk Prescription drug management. Parenteral controlled substances. Decision regarding hospitalization.   Iv ns. Continuous pulse ox and cardiac monitoring. Labs ordered/sent. Imaging ordered.   Differential diagnosis includes gastritis, pud, biliary colic, acute abd process, etc. Dispo decision including potential need for admission considered - will get labs and imaging and reassess.   Reviewed nursing notes and prior charts for additional history. External reports reviewed.   LR bolus. Morphine  iv. Zofran  iv. Protonix  iv   Cardiac monitor: sinus rhythm, rate 77.  Labs reviewed/interpreted by me - wbc 13, mildly high. Lipase normal.   CT reviewed/interpreted by me - +diverticulitis.   Recheck pain improved. No peritoneal signs. Zosyn iv.   Pt tolerated po, vitals normal. Pt appears stable for ed d/c. Rx provided.   Rec close pcp f/u.  Return precautions provided.       Final diagnoses:  Acute diverticulitis  Generalized abdominal pain    ED Discharge Orders          Ordered    amoxicillin-clavulanate (AUGMENTIN) 875-125 MG tablet   Every 12 hours        07/04/24 1400    oxyCODONE-acetaminophen  (PERCOCET/ROXICET) 5-325 MG tablet  Every 6 hours PRN        07/04/24 1400    ondansetron  (ZOFRAN -ODT) 8 MG disintegrating tablet  Every 8 hours PRN        07/04/24 1400               Jovontae Banko, MD 07/04/24 1401

## 2024-07-04 NOTE — Discharge Instructions (Addendum)
 It was our pleasure to provide your ER care today - we hope that you feel better. Drink plenty of fluids/stay well hydrated.   Take augmentin (antibiotic) as prescribed. Take motrin or aleve as need for pain. You may also take percocet as need for pain. No driving for the next 6 hours or when taking percocet. Also, do not take tylenol  or acetaminophen  containing medication when taking percocet.  You may take zofran  as need for nausea.   Follow up with primary care doctor in the coming week if symptoms fail to improve/resolve.  Return to ER if worse, new symptoms, severe or worsening or intractable pain, persistent vomiting, high fevers, or other concern.   You were given pain meds in the ER  - no driving for the next 6 hours.

## 2024-07-04 NOTE — ED Triage Notes (Signed)
 Dysuria and abdominal pressure . Denies hematuria . Hx diverticulitis .  2 weeks ago he vomited black and bright red emesis .

## 2024-09-27 ENCOUNTER — Encounter (HOSPITAL_BASED_OUTPATIENT_CLINIC_OR_DEPARTMENT_OTHER): Payer: Self-pay | Admitting: Emergency Medicine

## 2024-09-27 ENCOUNTER — Emergency Department (HOSPITAL_BASED_OUTPATIENT_CLINIC_OR_DEPARTMENT_OTHER)
Admission: EM | Admit: 2024-09-27 | Discharge: 2024-09-27 | Disposition: A | Discharge status: Home / Self Care | Attending: Emergency Medicine | Admitting: Emergency Medicine

## 2024-09-27 ENCOUNTER — Other Ambulatory Visit: Payer: Self-pay

## 2024-09-27 DIAGNOSIS — J101 Influenza due to other identified influenza virus with other respiratory manifestations: Secondary | ICD-10-CM | POA: Diagnosis not present

## 2024-09-27 DIAGNOSIS — R112 Nausea with vomiting, unspecified: Secondary | ICD-10-CM

## 2024-09-27 DIAGNOSIS — R509 Fever, unspecified: Secondary | ICD-10-CM | POA: Diagnosis present

## 2024-09-27 LAB — RESP PANEL BY RT-PCR (RSV, FLU A&B, COVID)  RVPGX2
Influenza A by PCR: POSITIVE — AB
Influenza B by PCR: NEGATIVE
Resp Syncytial Virus by PCR: NEGATIVE
SARS Coronavirus 2 by RT PCR: NEGATIVE

## 2024-09-27 LAB — GROUP A STREP BY PCR: Group A Strep by PCR: NOT DETECTED

## 2024-09-27 MED ORDER — ACETAMINOPHEN 325 MG PO TABS
650.0000 mg | ORAL_TABLET | Freq: Once | ORAL | Status: AC | PRN
  Administered 2024-09-27: 650 mg via ORAL
  Filled 2024-09-27: qty 2

## 2024-09-27 MED ORDER — ONDANSETRON 4 MG PO TBDP
8.0000 mg | ORAL_TABLET | Freq: Once | ORAL | Status: AC
  Administered 2024-09-27: 8 mg via ORAL
  Filled 2024-09-27: qty 2

## 2024-09-27 MED ORDER — IBUPROFEN 400 MG PO TABS
400.0000 mg | ORAL_TABLET | Freq: Once | ORAL | Status: AC
  Administered 2024-09-27: 400 mg via ORAL
  Filled 2024-09-27: qty 1

## 2024-09-27 MED ORDER — ONDANSETRON 8 MG PO TBDP: 8.0000 mg | ORAL_TABLET | Freq: Three times a day (TID) | ORAL | 0 refills | Status: AC | PRN

## 2024-09-27 NOTE — ED Triage Notes (Signed)
 Pt reports sore throat, generalized body aches, chills & dry cough since sunday night.

## 2024-09-27 NOTE — Discharge Instructions (Signed)
 Drink plenty of fluids.  Take acetaminophen  and/or ibuprofen  as needed for fever or aching.  Please be aware that if you combine acetaminophen  and ibuprofen , you will get better pain and fever relief than you get from taking other medication by itself.

## 2024-09-27 NOTE — ED Provider Notes (Signed)
 "  EMERGENCY DEPARTMENT AT [facility] Provider Note   CSN: [id number] Arrival date & time: 09/27/24  0008     Patient presents with: Sore Throat and Generalized Body Aches   Mario Simon is a 38 y.o. male.   The history is provided by the patient.  Sore Throat   He states that his daughter came home from college 4 days ago, and 2 days ago he started having bodyaches, subjective fever, sweats.  There has been a mild sore throat but he denies any cough.  He has vomited several times, denies any diarrhea.  Multiple other family members have also gotten sick over the same time period.    Prior to Admission medications  Medication Sig Start Date End Date Taking? Authorizing Provider  amoxicillin -clavulanate (AUGMENTIN ) 875-125 MG tablet Take 1 tablet by mouth every 12 (twelve) hours. 07/04/24   Steinl, Kevin, MD  hyoscyamine  (LEVSIN  SL) 0.125 MG SL tablet Place 1 tablet (0.125 mg total) under the tongue every 4 (four) hours as needed. Patient not taking: Reported on 12/26/2020 11/15/20   Zehr, Jessica D, PA-C  ondansetron  (ZOFRAN -ODT) 8 MG disintegrating tablet Take 1 tablet (8 mg total) by mouth every 8 (eight) hours as needed for nausea or vomiting. 07/04/24   Steinl, Kevin, MD  oxyCODONE -acetaminophen  (PERCOCET/ROXICET) 5-325 MG tablet Take 1 tablet by mouth every 6 (six) hours as needed for severe pain (pain score 7-10). 07/04/24   Bernard Drivers, MD  pantoprazole  (PROTONIX ) 40 MG tablet Take 1 tablet (40 mg total) by mouth daily. 12/26/20   Legrand Victory LITTIE DOUGLAS, MD    Allergies: Patient has no known allergies.    Review of Systems  All other systems reviewed and are negative.   Updated Vital Signs BP 129/85 (BP Location: Right Arm)   Pulse (!) 113   Temp 100 F (37.8 C)   Resp 18   SpO2 99%   Physical Exam Vitals and nursing note reviewed.   38 year old male, resting comfortably and in no acute distress. Vital signs are significant for slightly elevated  heart rate. Oxygen saturation is 99%, which is normal. Head is normocephalic and atraumatic. PERRLA, EOMI. Oropharynx is clear. Neck is nontender and supple without adenopathy. Lungs are clear without rales, wheezes, or rhonchi. Chest is nontender. Heart has regular rate and rhythm without murmur. Abdomen is soft, flat, nontender. Skin is warm and dry without rash. Neurologic: Mental status is normal, cranial nerves are intact, moves all extremities equally.  (all labs ordered are listed, but only abnormal results are displayed) Labs Reviewed  RESP PANEL BY RT-PCR (RSV, FLU A&B, COVID)  RVPGX2 - Abnormal; Notable for the following components:      Result Value   Influenza A by PCR POSITIVE (*)    All other components within normal limits  GROUP A STREP BY PCR     Procedures   Medications Ordered in the ED  acetaminophen  (TYLENOL ) tablet 650 mg (650 mg Oral Given 09/27/24 0019)  ondansetron  (ZOFRAN -ODT) disintegrating tablet 8 mg (8 mg Oral Given 09/27/24 0326)  ibuprofen  (ADVIL ) tablet 400 mg (400 mg Oral Given 09/27/24 0325)                                    Medical Decision Making Risk OTC drugs. Prescription drug management.   Symptom complex most consistent with a viral illness such as influenza or RSV or  COVID-19.  I have reviewed his laboratory tests, and my interpretation is negative PCR for group A strep, positive PCR for influenza A which is apparently what is causing his symptoms.  I have discussed possible antiviral treatment since he is within the treatment window to initiate antiviral therapy.  But, after discussing risks and benefits of antiviral treatment, through shared decision making decision was made to not initiate antiviral treatment.  I am discharging him with a prescription for ondansetron  oral dissolving tablet, advised him to use over-the-counter NSAIDs and acetaminophen  as needed for fever and aching.     Final diagnoses:  Influenza A  Nausea and  vomiting, unspecified vomiting type    ED Discharge Orders          Ordered    ondansetron  (ZOFRAN -ODT) 8 MG disintegrating tablet  Every 8 hours PRN        09/27/24 0310               Raford Lenis, MD 09/27/24 [phone]  "

## 2024-09-27 NOTE — ED Notes (Signed)
